# Patient Record
Sex: Female | Born: 1980
Health system: Southern US, Community
[De-identification: ages and names within clinical notes are randomized; demographics above are authoritative.]

## PROBLEM LIST (undated history)

## (undated) DIAGNOSIS — R011 Cardiac murmur, unspecified: Secondary | ICD-10-CM

## (undated) DIAGNOSIS — A6000 Herpesviral infection of urogenital system, unspecified: Secondary | ICD-10-CM

## (undated) DIAGNOSIS — N809 Endometriosis, unspecified: Secondary | ICD-10-CM

## (undated) DIAGNOSIS — Z789 Other specified health status: Secondary | ICD-10-CM

## (undated) DIAGNOSIS — D649 Anemia, unspecified: Secondary | ICD-10-CM

## (undated) HISTORY — PX: NO PAST SURGERIES: SHX2092

## (undated) HISTORY — DX: Anemia, unspecified: D64.9

---

## 2011-05-25 ENCOUNTER — Inpatient Hospital Stay (INDEPENDENT_AMBULATORY_CARE_PROVIDER_SITE_OTHER)
Admission: RE | Admit: 2011-05-25 | Discharge: 2011-05-25 | Disposition: A | Discharge status: Home / Self Care | Payer: Medicaid Other | Source: Ambulatory Visit | Attending: Family Medicine | Admitting: Family Medicine

## 2011-05-25 DIAGNOSIS — N760 Acute vaginitis: Secondary | ICD-10-CM

## 2011-05-25 LAB — POCT URINALYSIS DIP (DEVICE)
Hgb urine dipstick: NEGATIVE
Protein, ur: NEGATIVE mg/dL
Specific Gravity, Urine: 1.015 (ref 1.005–1.030)
Urobilinogen, UA: 0.2 mg/dL (ref 0.0–1.0)
pH: 7 (ref 5.0–8.0)

## 2011-05-25 LAB — WET PREP, GENITAL

## 2011-05-27 LAB — GC/CHLAMYDIA PROBE AMP, GENITAL
Chlamydia, DNA Probe: NEGATIVE
GC Probe Amp, Genital: NEGATIVE

## 2011-06-03 ENCOUNTER — Inpatient Hospital Stay (INDEPENDENT_AMBULATORY_CARE_PROVIDER_SITE_OTHER)
Admission: RE | Admit: 2011-06-03 | Discharge: 2011-06-03 | Disposition: A | Discharge status: Home / Self Care | Payer: Self-pay | Source: Ambulatory Visit | Attending: Family Medicine | Admitting: Family Medicine

## 2011-06-03 DIAGNOSIS — N760 Acute vaginitis: Secondary | ICD-10-CM

## 2011-06-03 LAB — POCT URINALYSIS DIP (DEVICE)
Bilirubin Urine: NEGATIVE
Glucose, UA: NEGATIVE mg/dL
Leukocytes, UA: NEGATIVE
Nitrite: NEGATIVE

## 2011-07-11 ENCOUNTER — Inpatient Hospital Stay (INDEPENDENT_AMBULATORY_CARE_PROVIDER_SITE_OTHER)
Admission: RE | Admit: 2011-07-11 | Discharge: 2011-07-11 | Disposition: A | Discharge status: Home / Self Care | Payer: Medicaid Other | Source: Ambulatory Visit | Attending: Emergency Medicine | Admitting: Emergency Medicine

## 2011-07-11 DIAGNOSIS — N771 Vaginitis, vulvitis and vulvovaginitis in diseases classified elsewhere: Secondary | ICD-10-CM

## 2011-07-11 LAB — POCT URINALYSIS DIP (DEVICE)
Bilirubin Urine: NEGATIVE
Glucose, UA: NEGATIVE mg/dL
Leukocytes, UA: NEGATIVE
Nitrite: NEGATIVE
Urobilinogen, UA: 0.2 mg/dL (ref 0.0–1.0)
pH: 6.5 (ref 5.0–8.0)

## 2011-07-11 LAB — WET PREP, GENITAL
Trich, Wet Prep: NONE SEEN
Yeast Wet Prep HPF POC: NONE SEEN

## 2011-07-13 LAB — GC/CHLAMYDIA PROBE AMP, GENITAL: Chlamydia, DNA Probe: NEGATIVE

## 2011-07-31 ENCOUNTER — Encounter: Payer: Self-pay | Admitting: *Deleted

## 2011-07-31 ENCOUNTER — Emergency Department (HOSPITAL_BASED_OUTPATIENT_CLINIC_OR_DEPARTMENT_OTHER)
Admission: EM | Admit: 2011-07-31 | Discharge: 2011-07-31 | Disposition: A | Discharge status: Home / Self Care | Payer: Self-pay | Attending: Emergency Medicine | Admitting: Emergency Medicine

## 2011-07-31 DIAGNOSIS — N898 Other specified noninflammatory disorders of vagina: Secondary | ICD-10-CM | POA: Insufficient documentation

## 2011-07-31 DIAGNOSIS — F17200 Nicotine dependence, unspecified, uncomplicated: Secondary | ICD-10-CM | POA: Insufficient documentation

## 2011-07-31 DIAGNOSIS — N939 Abnormal uterine and vaginal bleeding, unspecified: Secondary | ICD-10-CM

## 2011-07-31 LAB — CBC
HCT: 39.8 % (ref 36.0–46.0)
Hemoglobin: 13.8 g/dL (ref 12.0–15.0)
MCHC: 34.7 g/dL (ref 30.0–36.0)
RBC: 4.41 MIL/uL (ref 3.87–5.11)

## 2011-07-31 LAB — PROTIME-INR: INR: 1.01 (ref 0.00–1.49)

## 2011-07-31 LAB — WET PREP, GENITAL: Yeast Wet Prep HPF POC: NONE SEEN

## 2011-07-31 LAB — URINALYSIS, ROUTINE W REFLEX MICROSCOPIC
Glucose, UA: NEGATIVE mg/dL
Ketones, ur: NEGATIVE mg/dL
Leukocytes, UA: NEGATIVE
Nitrite: NEGATIVE
Specific Gravity, Urine: 1.015 (ref 1.005–1.030)
pH: 6 (ref 5.0–8.0)

## 2011-07-31 LAB — DIFFERENTIAL
Basophils Relative: 0 % (ref 0–1)
Lymphs Abs: 2.5 10*3/uL (ref 0.7–4.0)
Monocytes Absolute: 0.5 10*3/uL (ref 0.1–1.0)
Monocytes Relative: 6 % (ref 3–12)
Neutro Abs: 4.8 10*3/uL (ref 1.7–7.7)

## 2011-07-31 LAB — PREGNANCY, URINE: Preg Test, Ur: NEGATIVE

## 2011-07-31 LAB — URINE MICROSCOPIC-ADD ON

## 2011-07-31 NOTE — ED Notes (Signed)
Pt c/o vaginal bleeding x 10 days

## 2011-08-01 LAB — GC/CHLAMYDIA PROBE AMP, GENITAL
Chlamydia, DNA Probe: NEGATIVE
GC Probe Amp, Genital: NEGATIVE

## 2011-08-01 NOTE — ED Provider Notes (Signed)
History     CSN: [id number] Arrival date & time: 07/31/2011 10:24 PM  Chief Complaint  Patient presents with  . Vaginal Bleeding   HPI Comments: C/o vaginal bleeding x 10 days.  Unable to quantify, but may be as many as 5 pads per day with small clots.  Bleeding started after she had taken a friend's OCPs for a month.  Associated with crampy suprapubic pain.  No fever, vomiting, back pain.  No dysuria.  Afraid to take motrin for pain because of the bleeding.  LMP 3 weeks ago.  Does not have PCP.  Denies chest pain, SOB, dizziness, lightheadedness.  Does feel fatigue.  The history is provided by the patient.    History reviewed. No pertinent past medical history.  History reviewed. No pertinent past surgical history.  History reviewed. No pertinent family history.  History  Substance Use Topics  . Smoking status: Current Some Day Smoker -- 0.5 packs/day  . Smokeless tobacco: Not on file  . Alcohol Use: No    OB History    Grav Para Term Preterm Abortions TAB SAB Ect Mult Living                  Review of Systems  Constitutional: Positive for fatigue. Negative for fever, activity change and appetite change.  HENT: Negative for congestion and rhinorrhea.   Eyes: Negative for visual disturbance.  Respiratory: Negative for cough and shortness of breath.   Cardiovascular: Negative for chest pain.  Gastrointestinal: Positive for abdominal pain. Negative for nausea and vomiting.  Genitourinary: Positive for vaginal bleeding, vaginal pain and pelvic pain.  Musculoskeletal: Negative for back pain.  Skin: Negative for pallor.  Neurological: Positive for weakness. Negative for dizziness, light-headedness and headaches.    Physical Exam  BP 124/78  Pulse 84  Temp 98.4 F (36.9 C)  Resp 18  Wt 120 lb (54.432 kg)  SpO2 100%  LMP 07/31/2011  Physical Exam  Constitutional: She is oriented to person, place, and time. She appears well-developed and well-nourished. No distress.      Tearful because "I'm so embarrassed"  HENT:  Head: Normocephalic and atraumatic.  Mouth/Throat: Oropharynx is clear and moist. No oropharyngeal exudate.  Eyes: Conjunctivae are normal. Pupils are equal, round, and reactive to light.  Neck: Normal range of motion.  Cardiovascular: Normal rate, regular rhythm and normal heart sounds.   Pulmonary/Chest: Effort normal and breath sounds normal. No respiratory distress.  Abdominal: Soft. There is tenderness. There is no rebound and no guarding.       Suprapubic tenderness  Genitourinary: Cervix exhibits no motion tenderness. Right adnexum displays no mass. Left adnexum displays no mass.       Dark blood in vaginal vault, no active bleeding  Musculoskeletal: Normal range of motion. She exhibits no edema and no tenderness.  Neurological: She is alert and oriented to person, place, and time. No cranial nerve deficit.  Skin: Skin is warm.    ED Course  Procedures  MDM Vaginal bleeding, r/o pregnancy.  Vitals stable. Will check orthostatics, h/h, coags, UA.  HCG neg, Hemoglobin normal.  Orthostatics mildly positive.  Tolerating PO in ED. Advised patient to refrain from taking unprescribed hormone therapy.  GYN referral given.  Results for orders placed during the hospital encounter of 07/31/11  CBC      Component Value Range   WBC 8.0  4.0 - 10.5 (K/uL)   RBC 4.41  3.87 - 5.11 (MIL/uL)   Hemoglobin 13.8  12.0 -  15.0 (g/dL)   HCT 16.1  09.6 - 04.5 (%)   MCV 90.2  78.0 - 100.0 (fL)   MCH 31.3  26.0 - 34.0 (pg)   MCHC 34.7  30.0 - 36.0 (g/dL)   RDW 40.9  81.1 - 91.4 (%)   Platelets 212  150 - 400 (K/uL)  DIFFERENTIAL      Component Value Range   Neutrophils Relative 60  43 - 77 (%)   Neutro Abs 4.8  1.7 - 7.7 (K/uL)   Lymphocytes Relative 31  12 - 46 (%)   Lymphs Abs 2.5  0.7 - 4.0 (K/uL)   Monocytes Relative 6  3 - 12 (%)   Monocytes Absolute 0.5  0.1 - 1.0 (K/uL)   Eosinophils Relative 3  0 - 5 (%)   Eosinophils Absolute 0.2   0.0 - 0.7 (K/uL)   Basophils Relative 0  0 - 1 (%)   Basophils Absolute 0.0  0.0 - 0.1 (K/uL)  PROTIME-INR      Component Value Range   Prothrombin Time 13.5  11.6 - 15.2 (seconds)   INR 1.01  0.00 - 1.49   URINALYSIS, ROUTINE W REFLEX MICROSCOPIC      Component Value Range   Color, Urine YELLOW  YELLOW    Appearance CLEAR  CLEAR    Specific Gravity, Urine 1.015  1.005 - 1.030    pH 6.0  5.0 - 8.0    Glucose, UA NEGATIVE  NEGATIVE (mg/dL)   Hgb urine dipstick LARGE (*) NEGATIVE    Bilirubin Urine NEGATIVE  NEGATIVE    Ketones, ur NEGATIVE  NEGATIVE (mg/dL)   Protein, ur NEGATIVE  NEGATIVE (mg/dL)   Urobilinogen, UA 1.0  0.0 - 1.0 (mg/dL)   Nitrite NEGATIVE  NEGATIVE    Leukocytes, UA NEGATIVE  NEGATIVE   PREGNANCY, URINE      Component Value Range   Preg Test, Ur NEGATIVE    WET PREP, GENITAL      Component Value Range   Yeast, Wet Prep NONE SEEN  NONE SEEN    Trich, Wet Prep NONE SEEN  NONE SEEN    Clue Cells, Wet Prep NONE SEEN  NONE SEEN    WBC, Wet Prep HPF POC FEW (*) NONE SEEN   URINE MICROSCOPIC-ADD ON      Component Value Range   Squamous Epithelial / LPF RARE  RARE    WBC, UA 0-2  <3 (WBC/hpf)   RBC / HPF 21-50  <3 (RBC/hpf)   Bacteria, UA FEW (*) RARE    No results found.        Glynn Octave, MD 08/01/11 1125

## 2012-05-18 ENCOUNTER — Emergency Department (HOSPITAL_COMMUNITY)
Admission: EM | Admit: 2012-05-18 | Discharge: 2012-05-18 | Disposition: A | Discharge status: Home / Self Care | Payer: Self-pay | Attending: Emergency Medicine | Admitting: Emergency Medicine

## 2012-05-18 ENCOUNTER — Encounter (HOSPITAL_COMMUNITY): Payer: Self-pay | Admitting: *Deleted

## 2012-05-18 DIAGNOSIS — L0390 Cellulitis, unspecified: Secondary | ICD-10-CM

## 2012-05-18 DIAGNOSIS — L02419 Cutaneous abscess of limb, unspecified: Secondary | ICD-10-CM | POA: Insufficient documentation

## 2012-05-18 DIAGNOSIS — F17200 Nicotine dependence, unspecified, uncomplicated: Secondary | ICD-10-CM | POA: Insufficient documentation

## 2012-05-18 DIAGNOSIS — L03119 Cellulitis of unspecified part of limb: Secondary | ICD-10-CM | POA: Insufficient documentation

## 2012-05-18 MED ORDER — HYDROCODONE-ACETAMINOPHEN 5-325 MG PO TABS: 1.0000 | ORAL_TABLET | ORAL | Status: AC | PRN

## 2012-05-18 MED ORDER — CEPHALEXIN 500 MG PO CAPS: 500.0000 mg | ORAL_CAPSULE | Freq: Four times a day (QID) | ORAL | Status: AC

## 2012-05-18 MED ORDER — CEPHALEXIN 250 MG PO CAPS
500.0000 mg | ORAL_CAPSULE | Freq: Once | ORAL | Status: AC
  Administered 2012-05-18: 500 mg via ORAL
  Filled 2012-05-18: qty 2

## 2012-05-18 MED ORDER — HYDROCODONE-ACETAMINOPHEN 5-325 MG PO TABS
1.0000 | ORAL_TABLET | Freq: Once | ORAL | Status: AC
  Administered 2012-05-18: 1 via ORAL
  Filled 2012-05-18: qty 1

## 2012-05-18 NOTE — ED Notes (Signed)
C/o R medial thigh redness, pain and swelling. 1st noticed Saturday night. Gradually progressively worse, larger. Also report itching. Has had benadryl 50mg  ~ 30 minutes.

## 2012-05-18 NOTE — ED Provider Notes (Signed)
History     CSN: [id number]  Arrival date & time 05/18/12  4098   First MD Initiated Contact with Patient 05/18/12 0340      Chief Complaint  Patient presents with  . Insect Bite    (Consider location/radiation/quality/duration/timing/severity/associated sxs/prior treatment) HPI Comments: Patient here with what she thinks may have been an insect bite to her right upper thigh - she states that since then the area has been itchy and now with redness surrounding the area.  She denies fever, chills, nausea, vomiting, - she took benadryl to help with the itching but nothing for the pain.  Patient is a 31 y.o. female presenting with rash. The history is provided by the patient. No language interpreter was used.  Rash  This is a new problem. The current episode started 2 days ago. The problem has not changed since onset.The problem is associated with an insect bite/sting. There has been no fever. The rash is present on the right upper leg. The pain is at a severity of 5/10. The pain is moderate. The pain has been constant since onset. Associated symptoms include itching and pain. Pertinent negatives include no blisters and no weeping.    History reviewed. No pertinent past medical history.  History reviewed. No pertinent past surgical history.  No family history on file.  History  Substance Use Topics  . Smoking status: Current Some Day Smoker -- 0.5 packs/day  . Smokeless tobacco: Not on file  . Alcohol Use: No    OB History    Grav Para Term Preterm Abortions TAB SAB Ect Mult Living                  Review of Systems  Constitutional: Negative for fever and chills.  HENT: Negative for neck pain.   Eyes: Negative for pain.  Respiratory: Negative for chest tightness and shortness of breath.   Cardiovascular: Negative for chest pain.  Gastrointestinal: Negative for nausea, vomiting and abdominal pain.  Genitourinary: Negative for dysuria.  Musculoskeletal: Negative for back  pain.  Skin: Positive for itching and rash.  Neurological: Negative for headaches.  All other systems reviewed and are negative.    Allergies  Review of patient's allergies indicates no known allergies.  Home Medications   Current Outpatient Rx  Name Route Sig Dispense Refill  . ONE-DAILY MULTI VITAMINS PO TABS Oral Take 1 tablet by mouth daily.        BP 95/52  Pulse 87  Temp 98.6 F (37 C) (Oral)  Resp 16  SpO2 98%  LMP 05/13/2012  Physical Exam  Nursing note and vitals reviewed. Constitutional: She is oriented to person, place, and time. She appears well-developed and well-nourished. No distress.  HENT:  Head: Normocephalic and atraumatic.  Right Ear: External ear normal.  Left Ear: External ear normal.  Nose: Nose normal.  Mouth/Throat: Oropharynx is clear and moist. No oropharyngeal exudate.  Eyes: Conjunctivae are normal. Pupils are equal, round, and reactive to light. No scleral icterus.  Neck: Normal range of motion. Neck supple.  Cardiovascular: Normal rate, regular rhythm and normal heart sounds.  Exam reveals no gallop and no friction rub.   No murmur heard. Pulmonary/Chest: Effort normal and breath sounds normal. No respiratory distress. She has no wheezes. She has no rales. She exhibits no tenderness.  Abdominal: Soft. Bowel sounds are normal. She exhibits no distension. There is no tenderness.  Musculoskeletal: Normal range of motion. She exhibits no edema and no tenderness.  Lymphadenopathy:  She has no cervical adenopathy.  Neurological: She is alert and oriented to person, place, and time. No cranial nerve deficit. She exhibits normal muscle tone. Coordination normal.  Skin: Skin is warm and dry. Rash noted. There is erythema. No pallor.       10 cm x 5 cm area of induration to right upper thigh, no fluctuance noted.  Psychiatric: She has a normal mood and affect. Her behavior is normal. Judgment and thought content normal.    ED Course  Procedures  (including critical care time)  Labs Reviewed - No data to display No results found.   Cellulitis to right thigh   MDM  Patient here with cellulitis to the right thigh, she is afebrile and there are no signs of systemic infection, I do not suspect tick or vector borne infection as well.  Started on abx here and she will return to the Urgent Care in 2 days for re-check.        Izola Price [place], [place] 05/18/12 [phone]

## 2012-05-18 NOTE — ED Provider Notes (Signed)
Medical screening examination/treatment/procedure(s) were performed by non-physician practitioner and as supervising physician I was immediately available for consultation/collaboration.   Jasmine Awe, MD 05/18/12 [phone]

## 2012-05-18 NOTE — ED Notes (Addendum)
Pt to ED c/o infected bite.  She does not know what bit her, but she remembers getting bitten by an insect.  Approx 6 x 6 red, warm and hard site with a pustule the size of a pinhead at the center.  Pt denies sob or fevers.

## 2012-05-18 NOTE — Discharge Instructions (Signed)

## 2012-12-24 ENCOUNTER — Emergency Department (INDEPENDENT_AMBULATORY_CARE_PROVIDER_SITE_OTHER)
Admission: EM | Admit: 2012-12-24 | Discharge: 2012-12-24 | Disposition: A | Discharge status: Home / Self Care | Payer: BC Managed Care – PPO | Source: Home / Self Care | Attending: Emergency Medicine | Admitting: Emergency Medicine

## 2012-12-24 ENCOUNTER — Encounter (HOSPITAL_COMMUNITY): Payer: Self-pay | Admitting: *Deleted

## 2012-12-24 DIAGNOSIS — M65839 Other synovitis and tenosynovitis, unspecified forearm: Secondary | ICD-10-CM

## 2012-12-24 DIAGNOSIS — M778 Other enthesopathies, not elsewhere classified: Secondary | ICD-10-CM

## 2012-12-24 MED ORDER — CYCLOBENZAPRINE HCL 5 MG PO TABS: 5.0000 mg | ORAL_TABLET | Freq: Three times a day (TID) | ORAL | Status: DC | PRN

## 2012-12-24 MED ORDER — NAPROXEN 500 MG PO TABS: 500.0000 mg | ORAL_TABLET | Freq: Two times a day (BID) | ORAL | Status: DC

## 2012-12-24 NOTE — ED Provider Notes (Signed)
History     CSN: [id number]  Arrival date & time 12/24/12  1135   First MD Initiated Contact with Patient 12/24/12 1359      No chief complaint on file.   (Consider location/radiation/quality/duration/timing/severity/associated sxs/prior treatment) HPI Comments: Pt has had pain in R thenar area before, usually with gripping flat iron for hair.  2 weeks ago, pain became more constant, and has gradually gotten worse. Denies injury.  Patient is a 32 y.o. female presenting with hand pain.  Hand Pain This is a new problem. Episode onset: 2 weeks ago. The problem occurs constantly. The problem has been gradually worsening. The symptoms are aggravated by exertion (using hand for gripping or twisting caps off bottles (pt works at Associate Professor at [employer])). Nothing relieves the symptoms. She has tried a warm compress and rest (ibuprofen once yesterday) for the symptoms. The treatment provided no relief.    History reviewed. No pertinent past medical history.  History reviewed. No pertinent past surgical history.  History reviewed. No pertinent family history.  History  Substance Use Topics  . Smoking status: Current Some Day Smoker -- 0.5 packs/day  . Smokeless tobacco: Not on file  . Alcohol Use: No    OB History    Grav Para Term Preterm Abortions TAB SAB Ect Mult Living                  Review of Systems  Constitutional: Negative for fever and chills.  Musculoskeletal: Positive for joint swelling.       Swelling of base of R thumb, pain with active use of hand  Skin: Negative for color change and rash.  Neurological: Negative for weakness and numbness.    Allergies  Review of patient's allergies indicates no known allergies.  Home Medications   Current Outpatient Rx  Name  Route  Sig  Dispense  Refill  . CYCLOBENZAPRINE HCL 5 MG PO TABS   Oral   Take 1 tablet (5 mg total) by mouth 3 (three) times daily as needed for muscle spasms.   21 tablet   0   . ONE-DAILY  MULTI VITAMINS PO TABS   Oral   Take 1 tablet by mouth daily.           Marland Kitchen NAPROXEN 500 MG PO TABS   Oral   Take 1 tablet (500 mg total) by mouth 2 (two) times daily.   28 tablet   0     BP 113/69  Pulse 65  Temp 98.3 F (36.8 C) (Oral)  Resp 16  SpO2 99%  LMP 12/10/2012  Physical Exam  Constitutional: She appears well-developed and well-nourished. No distress.  Musculoskeletal:       Right wrist: Normal.       Right hand: She exhibits decreased range of motion, tenderness and swelling. She exhibits no bony tenderness, normal capillary refill and no deformity. normal sensation noted.       Hands: Neurological: No sensory deficit. She exhibits normal muscle tone.    ED Course  Procedures (including critical care time)  Labs Reviewed - No data to display No results found.   1. Hand tendonitis       MDM  Likely repetitive use injury.  Will tx conservatively.  If not improvement sx, pt to return.  Would consider referral to hand at that time.         Cathlyn Parsons, NP 12/24/12 [phone]

## 2012-12-24 NOTE — ED Provider Notes (Signed)
Medical screening examination/treatment/procedure(s) were performed by non-physician practitioner and as supervising physician I was immediately available for consultation/collaboration.  Kyre Jeffries, M.D.   Dilyn Smiles C Maximos Zayas, MD 12/24/12 2110 

## 2012-12-24 NOTE — ED Notes (Signed)
Pt reports     Pain  r  Hand      X  sev  Weeks     Pain is  Worse  On  Certain  Functions     denys  Any  specefic  Injury

## 2012-12-24 NOTE — ED Notes (Signed)
Universal  r   Wrist   Splint  Applied    

## 2013-03-18 ENCOUNTER — Encounter (HOSPITAL_COMMUNITY): Payer: Self-pay | Admitting: Pharmacist

## 2013-03-26 ENCOUNTER — Encounter (HOSPITAL_COMMUNITY)
Admission: RE | Admit: 2013-03-26 | Discharge: 2013-03-26 | Disposition: A | Discharge status: Home / Self Care | Payer: BC Managed Care – PPO | Source: Ambulatory Visit | Attending: Obstetrics and Gynecology | Admitting: Obstetrics and Gynecology

## 2013-03-26 ENCOUNTER — Encounter (HOSPITAL_COMMUNITY): Payer: Self-pay

## 2013-03-26 DIAGNOSIS — Z01818 Encounter for other preprocedural examination: Secondary | ICD-10-CM | POA: Insufficient documentation

## 2013-03-26 DIAGNOSIS — Z01812 Encounter for preprocedural laboratory examination: Secondary | ICD-10-CM | POA: Insufficient documentation

## 2013-03-26 HISTORY — DX: Other specified health status: Z78.9

## 2013-03-26 LAB — CBC
MCH: 33.2 pg (ref 26.0–34.0)
MCV: 96.9 fL (ref 78.0–100.0)
Platelets: 181 10*3/uL (ref 150–400)
RDW: 13.4 % (ref 11.5–15.5)

## 2013-03-26 NOTE — Patient Instructions (Signed)
Your procedure is scheduled on:04/02/13  Enter through the Main Entrance at :11am Pick up desk phone and dial [phone] and inform us of your arrival.  Please call [phone] if you have any problems the morning of surgery.  Remember: Do not eat after midnight:Thursday Clear liquids ok until 0830 am on FRIDAY   DO NOT wear jewelry, eye make-up, lipstick,body lotion, or dark fingernail polish.   Patients discharged on the day of surgery will not be allowed to drive home.

## 2013-04-02 ENCOUNTER — Encounter (HOSPITAL_COMMUNITY): Payer: Self-pay | Admitting: Anesthesiology

## 2013-04-02 ENCOUNTER — Encounter (HOSPITAL_COMMUNITY)
Admission: RE | Disposition: A | Discharge status: Home / Self Care | Payer: Self-pay | Source: Ambulatory Visit | Attending: Obstetrics and Gynecology

## 2013-04-02 ENCOUNTER — Ambulatory Visit (HOSPITAL_COMMUNITY)
Admission: RE | Admit: 2013-04-02 | Discharge: 2013-04-02 | Disposition: A | Discharge status: Home / Self Care | Payer: BC Managed Care – PPO | Source: Ambulatory Visit | Attending: Obstetrics and Gynecology | Admitting: Obstetrics and Gynecology

## 2013-04-02 ENCOUNTER — Ambulatory Visit (HOSPITAL_COMMUNITY): Payer: BC Managed Care – PPO | Admitting: Anesthesiology

## 2013-04-02 DIAGNOSIS — N920 Excessive and frequent menstruation with regular cycle: Secondary | ICD-10-CM | POA: Insufficient documentation

## 2013-04-02 DIAGNOSIS — N979 Female infertility, unspecified: Secondary | ICD-10-CM | POA: Insufficient documentation

## 2013-04-02 DIAGNOSIS — N803 Endometriosis of pelvic peritoneum, unspecified: Secondary | ICD-10-CM | POA: Insufficient documentation

## 2013-04-02 DIAGNOSIS — N80109 Endometriosis of ovary, unspecified side, unspecified depth: Secondary | ICD-10-CM | POA: Insufficient documentation

## 2013-04-02 DIAGNOSIS — N801 Endometriosis of ovary: Secondary | ICD-10-CM | POA: Insufficient documentation

## 2013-04-02 DIAGNOSIS — IMO0002 Reserved for concepts with insufficient information to code with codable children: Secondary | ICD-10-CM | POA: Insufficient documentation

## 2013-04-02 DIAGNOSIS — D251 Intramural leiomyoma of uterus: Secondary | ICD-10-CM | POA: Insufficient documentation

## 2013-04-02 HISTORY — PX: LAPAROSCOPY: SHX197

## 2013-04-02 HISTORY — PX: LAPAROSCOPIC LYSIS OF ADHESIONS: SHX5905

## 2013-04-02 HISTORY — PX: MYOMECTOMY: SHX85

## 2013-04-02 LAB — TYPE AND SCREEN
ABO/RH(D): B POS
Antibody Screen: NEGATIVE

## 2013-04-02 LAB — PREGNANCY, URINE: Preg Test, Ur: NEGATIVE

## 2013-04-02 LAB — ABO/RH: ABO/RH(D): B POS

## 2013-04-02 SURGERY — LAPAROSCOPY OPERATIVE
Anesthesia: General | Wound class: Clean Contaminated

## 2013-04-02 MED ORDER — BUPIVACAINE-EPINEPHRINE 0.25% -1:200000 IJ SOLN
INTRAMUSCULAR | Status: DC | PRN
  Administered 2013-04-02: 8 mL

## 2013-04-02 MED ORDER — PROPOFOL 10 MG/ML IV BOLUS
INTRAVENOUS | Status: DC | PRN
  Administered 2013-04-02: 170 mg via INTRAVENOUS

## 2013-04-02 MED ORDER — KETOROLAC TROMETHAMINE 30 MG/ML IJ SOLN
INTRAMUSCULAR | Status: DC | PRN
  Administered 2013-04-02: 30 mg via INTRAVENOUS

## 2013-04-02 MED ORDER — NEOSTIGMINE METHYLSULFATE 1 MG/ML IJ SOLN
INTRAMUSCULAR | Status: AC
  Filled 2013-04-02: qty 1

## 2013-04-02 MED ORDER — MIDAZOLAM HCL 5 MG/5ML IJ SOLN
INTRAMUSCULAR | Status: DC | PRN
  Administered 2013-04-02: 2 mg via INTRAVENOUS

## 2013-04-02 MED ORDER — KETOROLAC TROMETHAMINE 30 MG/ML IJ SOLN
INTRAMUSCULAR | Status: AC
  Filled 2013-04-02: qty 1

## 2013-04-02 MED ORDER — NORETHINDRONE ACETATE 5 MG PO TABS: 2.5000 mg | ORAL_TABLET | Freq: Every day | ORAL | Status: DC

## 2013-04-02 MED ORDER — LETROZOLE 2.5 MG PO TABS: 2.5000 mg | ORAL_TABLET | Freq: Every day | ORAL | Status: DC

## 2013-04-02 MED ORDER — HYDROCODONE-ACETAMINOPHEN 5-300 MG PO TABS: 2.0000 | ORAL_TABLET | Freq: Four times a day (QID) | ORAL | Status: DC | PRN

## 2013-04-02 MED ORDER — HYDROCODONE-ACETAMINOPHEN 5-325 MG PO TABS: 1.0000 | ORAL_TABLET | Freq: Once | ORAL | Status: AC

## 2013-04-02 MED ORDER — HYDROCODONE-ACETAMINOPHEN 5-325 MG PO TABS
ORAL_TABLET | ORAL | Status: AC
  Administered 2013-04-02: 1 via ORAL
  Filled 2013-04-02: qty 1

## 2013-04-02 MED ORDER — LACTATED RINGERS IV SOLN
INTRAVENOUS | Status: DC
  Administered 2013-04-02 (×2): via INTRAVENOUS

## 2013-04-02 MED ORDER — INDIGOTINDISULFONATE SODIUM 8 MG/ML IJ SOLN
INTRAMUSCULAR | Status: AC
  Filled 2013-04-02: qty 5

## 2013-04-02 MED ORDER — LIDOCAINE HCL (CARDIAC) 20 MG/ML IV SOLN
INTRAVENOUS | Status: DC | PRN
  Administered 2013-04-02: 50 mg via INTRAVENOUS

## 2013-04-02 MED ORDER — VASOPRESSIN 20 UNIT/ML IJ SOLN
INTRAMUSCULAR | Status: AC
  Filled 2013-04-02: qty 2

## 2013-04-02 MED ORDER — KETOROLAC TROMETHAMINE 30 MG/ML IJ SOLN: 15.0000 mg | Freq: Once | INTRAMUSCULAR | Status: DC | PRN

## 2013-04-02 MED ORDER — GLYCOPYRROLATE 0.2 MG/ML IJ SOLN
INTRAMUSCULAR | Status: AC
  Filled 2013-04-02: qty 2

## 2013-04-02 MED ORDER — FENTANYL CITRATE 0.05 MG/ML IJ SOLN
INTRAMUSCULAR | Status: AC
  Filled 2013-04-02: qty 5

## 2013-04-02 MED ORDER — FENTANYL CITRATE 0.05 MG/ML IJ SOLN
25.0000 ug | INTRAMUSCULAR | Status: DC | PRN
  Administered 2013-04-02 (×2): 50 ug via INTRAVENOUS

## 2013-04-02 MED ORDER — ONDANSETRON HCL 4 MG/2ML IJ SOLN: 4.0000 mg | Freq: Once | INTRAMUSCULAR | Status: DC | PRN

## 2013-04-02 MED ORDER — FENTANYL CITRATE 0.05 MG/ML IJ SOLN
INTRAMUSCULAR | Status: AC
  Administered 2013-04-02: 50 ug via INTRAVENOUS
  Filled 2013-04-02: qty 8

## 2013-04-02 MED ORDER — ROCURONIUM BROMIDE 50 MG/5ML IV SOLN
INTRAVENOUS | Status: AC
  Filled 2013-04-02: qty 1

## 2013-04-02 MED ORDER — ROCURONIUM BROMIDE 100 MG/10ML IV SOLN
INTRAVENOUS | Status: DC | PRN
  Administered 2013-04-02: 60 mg via INTRAVENOUS

## 2013-04-02 MED ORDER — DEXAMETHASONE SODIUM PHOSPHATE 10 MG/ML IJ SOLN
INTRAMUSCULAR | Status: AC
  Filled 2013-04-02: qty 1

## 2013-04-02 MED ORDER — MIDAZOLAM HCL 2 MG/2ML IJ SOLN
INTRAMUSCULAR | Status: AC
  Filled 2013-04-02: qty 2

## 2013-04-02 MED ORDER — ONDANSETRON HCL 4 MG/2ML IJ SOLN
INTRAMUSCULAR | Status: DC | PRN
  Administered 2013-04-02: 4 mg via INTRAVENOUS

## 2013-04-02 MED ORDER — VASOPRESSIN 20 UNIT/ML IJ SOLN
INTRAVENOUS | Status: DC | PRN
  Administered 2013-04-02: 15:00:00 via INTRAMUSCULAR

## 2013-04-02 MED ORDER — FENTANYL CITRATE 0.05 MG/ML IJ SOLN
INTRAMUSCULAR | Status: AC
  Filled 2013-04-02: qty 2

## 2013-04-02 MED ORDER — CEFAZOLIN SODIUM-DEXTROSE 2-3 GM-% IV SOLR
INTRAVENOUS | Status: AC
  Administered 2013-04-02: 2 g via INTRAVENOUS
  Filled 2013-04-02: qty 50

## 2013-04-02 MED ORDER — DEXAMETHASONE SODIUM PHOSPHATE 10 MG/ML IJ SOLN
INTRAMUSCULAR | Status: DC | PRN
  Administered 2013-04-02: 10 mg via INTRAVENOUS

## 2013-04-02 MED ORDER — ONDANSETRON HCL 4 MG/2ML IJ SOLN
INTRAMUSCULAR | Status: AC
  Filled 2013-04-02: qty 2

## 2013-04-02 MED ORDER — CEFAZOLIN SODIUM-DEXTROSE 2-3 GM-% IV SOLR: 2.0000 g | INTRAVENOUS | Status: DC

## 2013-04-02 MED ORDER — INDIGOTINDISULFONATE SODIUM 8 MG/ML IJ SOLN
INTRAMUSCULAR | Status: DC | PRN
  Administered 2013-04-02: 5 mL via INTRAVENOUS

## 2013-04-02 MED ORDER — HEPARIN SODIUM (PORCINE) 5000 UNIT/ML IJ SOLN
INTRAMUSCULAR | Status: DC | PRN
  Administered 2013-04-02 (×3): 5000 [IU] via SUBCUTANEOUS

## 2013-04-02 MED ORDER — NEOSTIGMINE METHYLSULFATE 1 MG/ML IJ SOLN
INTRAMUSCULAR | Status: DC | PRN
  Administered 2013-04-02: 3 mg via INTRAVENOUS

## 2013-04-02 MED ORDER — BUPIVACAINE HCL (PF) 0.25 % IJ SOLN
INTRAMUSCULAR | Status: AC
  Filled 2013-04-02: qty 30

## 2013-04-02 MED ORDER — CHLOROPROCAINE HCL 1 % IJ SOLN
INTRAMUSCULAR | Status: AC
  Filled 2013-04-02: qty 30

## 2013-04-02 MED ORDER — GLYCOPYRROLATE 0.2 MG/ML IJ SOLN
INTRAMUSCULAR | Status: DC | PRN
  Administered 2013-04-02: .5 mg via INTRAVENOUS

## 2013-04-02 MED ORDER — MEPERIDINE HCL 25 MG/ML IJ SOLN: 6.2500 mg | INTRAMUSCULAR | Status: DC | PRN

## 2013-04-02 MED ORDER — LIDOCAINE HCL (CARDIAC) 20 MG/ML IV SOLN
INTRAVENOUS | Status: AC
  Filled 2013-04-02: qty 5

## 2013-04-02 MED ORDER — FENTANYL CITRATE 0.05 MG/ML IJ SOLN
INTRAMUSCULAR | Status: DC | PRN
  Administered 2013-04-02: 150 ug via INTRAVENOUS
  Administered 2013-04-02 (×2): 50 ug via INTRAVENOUS

## 2013-04-02 MED ORDER — PROPOFOL 10 MG/ML IV EMUL
INTRAVENOUS | Status: AC
  Filled 2013-04-02: qty 20

## 2013-04-02 SURGICAL SUPPLY — 47 items
CABLE HIGH FREQUENCY MONO STRZ (ELECTRODE) IMPLANT
CATH ROBINSON RED A/P 16FR (CATHETERS) IMPLANT
CLOTH BEACON ORANGE TIMEOUT ST (SAFETY) ×2 IMPLANT
DERMABOND ADVANCED (GAUZE/BANDAGES/DRESSINGS) ×1
DERMABOND ADVANCED .7 DNX12 (GAUZE/BANDAGES/DRESSINGS) ×1 IMPLANT
DEVICE TROCAR PUNCTURE CLOSURE (ENDOMECHANICALS) IMPLANT
ELECT NEEDLE TIP 2.8 STRL (NEEDLE) IMPLANT
ELECT REM PT RETURN 9FT ADLT (ELECTROSURGICAL) ×2
ELECTRODE REM PT RTRN 9FT ADLT (ELECTROSURGICAL) ×1 IMPLANT
EVACUATOR SMOKE 8.L (FILTER) ×2 IMPLANT
FORCEPS CUTTING 33CM 5MM (CUTTING FORCEPS) IMPLANT
GAUZE SPONGE 4X4 16PLY XRAY LF (GAUZE/BANDAGES/DRESSINGS) ×2 IMPLANT
GLOVE BIO SURGEON STRL SZ8 (GLOVE) ×2 IMPLANT
GLOVE BIOGEL PI IND STRL 8.5 (GLOVE) ×1 IMPLANT
GLOVE BIOGEL PI INDICATOR 8.5 (GLOVE) ×1
GLOVE INDICATOR 8.5 STRL (GLOVE) ×2 IMPLANT
GOWN PREVENTION PLUS LG XLONG (DISPOSABLE) ×4 IMPLANT
GOWN STRL REIN XL XLG (GOWN DISPOSABLE) ×6 IMPLANT
MANIPULATOR UTERINE 4.5 ZUMI (MISCELLANEOUS) ×2 IMPLANT
NEEDLE INSUFFLATION 120MM (ENDOMECHANICALS) ×2 IMPLANT
NS IRRIG 1000ML POUR BTL (IV SOLUTION) ×2 IMPLANT
PACK ABDOMINAL GYN (CUSTOM PROCEDURE TRAY) IMPLANT
PACK LAPAROSCOPY BASIN (CUSTOM PROCEDURE TRAY) ×2 IMPLANT
PAD OB MATERNITY 4.3X12.25 (PERSONAL CARE ITEMS) ×2 IMPLANT
PENCIL BUTTON HOLSTER BLD 10FT (ELECTRODE) IMPLANT
POUCH SPECIMEN RETRIEVAL 10MM (ENDOMECHANICALS) IMPLANT
PROTECTOR NERVE ULNAR (MISCELLANEOUS) ×2 IMPLANT
SEPRAFILM MEMBRANE 5X6 (MISCELLANEOUS) ×4 IMPLANT
SET IRRIG TUBING LAPAROSCOPIC (IRRIGATION / IRRIGATOR) ×2 IMPLANT
SUT MNCRL AB 4-0 PS2 18 (SUTURE) ×4 IMPLANT
SUT PROLENE 0 CT 1 30 (SUTURE) IMPLANT
SUT VIC AB 2-0 CT1 27 (SUTURE)
SUT VIC AB 2-0 CT1 TAPERPNT 27 (SUTURE) IMPLANT
SUT VIC AB 2-0 CTB1 (SUTURE) ×4 IMPLANT
SUT VIC AB 2-0 UR6 27 (SUTURE) ×2 IMPLANT
SUT VICRYL 0 TIES 12 18 (SUTURE) IMPLANT
SYR 20CC LL (SYRINGE) IMPLANT
SYR 50ML LL SCALE MARK (SYRINGE) IMPLANT
SYR TOOMEY 50ML (SYRINGE) ×2 IMPLANT
SYS LAPSCP GELPORT 120MM (MISCELLANEOUS) ×2
SYSTEM LAPSCP GELPORT 120MM (MISCELLANEOUS) ×1 IMPLANT
TOWEL OR 17X24 6PK STRL BLUE (TOWEL DISPOSABLE) ×6 IMPLANT
TRAY FOLEY CATH 14FR (SET/KITS/TRAYS/PACK) ×2 IMPLANT
TROCAR OPTI TIP 5M 100M (ENDOMECHANICALS) ×6 IMPLANT
TROCAR XCEL DIL TIP R 11M (ENDOMECHANICALS) ×2 IMPLANT
WARMER LAPAROSCOPE (MISCELLANEOUS) ×2 IMPLANT
WATER STERILE IRR 1000ML POUR (IV SOLUTION) ×2 IMPLANT

## 2013-04-02 NOTE — Op Note (Signed)
Operative Note  Preoperative diagnosis: Transmural uterine leiomyoma, history of right ovarian cyst, severe dyspareunia  Postoperative diagnosis: Transmural uterine leiomyoma, stage IV endometriosis of tubes ovaries peritoneum and posterior cul-de-sac.  Procedure: Laparoscopy, GelPort assisted myomectomy, lysis of adhesions, excision of endometriosis, right salpingo-oophorolysis  Anesthesia: Gen. endotracheal  Complications: None  Estimated blood loss: 100 cc  Specimens: Myoma and endometrial polyp and anterior cul-de-sac biopsy to Pathology.  Findings: On exam under anesthesia the uterus was eight-week size, retroverted, firm and somewhat fixed. No cul-de-sac induration or nodularity was palpable. No adnexal enlargement was appreciated. On laparoscopy, upper abdomen, liver surface and diaphragm surfaces as well as gallbladder were normal. The appendix was within normal limits. Anterior cul-de-sac contained a cluster of brown and fibrotic lesions on the left. The uterus contained a 5 cm right anterior transmural myoma. Severe endometriosis related scarring was noted in the posterior pelvis. The cul-de-sac was completely obliterated. It was partially released by blunt dissection at the end of the case. The left tube was twisted and adherent to the broad ligament and the left ovary although the fimbriated end was visible. The left ovary was completely encased. After blunt dissection it could be 50% freed up. The right tube was similarly adherent onto itself as if it was triplicated. It had normal fimbriated end. Externally it appeared shortened, because of probable twisting with adhesions. The right ovary was adherent to the uterus and to the posterior cul-de-sac. The ovary was adherent with two thirds of its surface area but it was completely freed up by blunt and sharp dissection by the end of the case. Anterior surface contained a small endometrioma, which may explain why she had occasional findings  of a sonolucency on pelvic ultrasounds, as this area may be expanding with endometriotic blood. Description of the procedure: The patient was placed in dorsal supine position and general endotracheal anesthesia was given. 2 g of cefazolin were given intravenously for prophylaxis. Patient was placed in lithotomy position. She was prepped and draped inside manner. A Foley catheter was inserted into the bladder. A ZUMI catheter was placed into the uterine cavity. The uterus sounded to 8 cm. The surgeon was regloved and a surgical field was created on the abdomen.  After preemptive anesthesia of all surgical sites with 0.25% bupivacaine with 1:200,000 epinephrine, a 5 mm intraumbilical skin incision was made and a Verress needle was inserted. Its correct location was confirmed. A pneumoperitoneum was created with carbon dioxide. A left lower quadrant 5 mm and a right lower quadrant 10 mm incisions were made and ancillary trochars were placed under direct visualization. A 3 cm incision was made suprapubically and the fascia was cut transversely the rectus muscles were separated and the peritoneum was incised in vertical manner. A GelPort was placed and used throughout the case. Above findings were noted.  Using needle electrode the anterior cul-de-sac lesion was excised. 0.33 units per mL dilute vasopressin solution was injected into the superficial myometrium of the uterus. A transverse uterine incision was made over the myoma by the laparoscope and the myoma was dissected by blunt and sharp dissection. The endometrium was entered over a 1 cm segment and the polyp spontaneously was extruded. This was also sent to pathology. The myoma defect was cleared closed in 3 layers the first layer and the second layer were with 2-0 Vicryl continuous suture, the third layer was with 4-0 Vicryl continuous suture. Using blunt and needle electrode electrosurgical dissection both via the laparoscope and via the mean and  lap  incision extensive adhesiolyse this was carried out.  No dermoids were encountered (none were seen on ultrasound). Instead the small endometrioma between the right ovary and the uterine wall was noted. This may be collecting blood from time to time.   No further attempt was made to preop the twisted left tube since the adhesions were of cohesive type. Similarly no attempt was made to free up the left ovary from the left pelvic sidewall. The pelvis was vigorously irrigated and aspirated and hemostasis was checked. A slurry of 2 sheets of Seprafilm in lactated Ringer 40 mL was instilled into the pelvis as an adhesion barrier.  The gas was allowed to escape. Instruments were removed. The instrument and lap count were correct. The fascia of the 3 cm incision was approximated with 2-0 Vicryl on a UR 6 needle. The rest of the incisions were approximated with Dermabond.  The patient tolerated the procedure well and was transferred to recovery room in satisfactory condition. She will be placed on Femara and norethindrone acetate continuously for endometriosis suppression for 3 months. If she doesn't conceive through her right fallopian tube, she'll be a candidate for IVF.

## 2013-04-02 NOTE — Anesthesia Postprocedure Evaluation (Signed)
  Anesthesia Post Note  Patient: Katherine Holmes  Procedure(s) Performed: Procedure(s) (LRB): Operative Laparoscopy with Gel Port-Assisted Myomectomy and Excision of Endometriosis (N/A) ASSISTED MYOMECTOMY (N/A) LAPAROSCOPIC LYSIS OF ADHESIONS (N/A)  Anesthesia type: GA  Patient location: PACU  Post pain: Pain level controlled  Post assessment: Post-op Vital signs reviewed  Last Vitals:  Filed Vitals:   04/02/13 1540  BP: 117/67  Pulse: 71  Temp: 37.1 C  Resp: 16    Post vital signs: Reviewed  Level of consciousness: sedated  Complications: No apparent anesthesia complications

## 2013-04-02 NOTE — Anesthesia Preprocedure Evaluation (Signed)
Anesthesia Evaluation  Patient identified by MRN, date of birth, ID band Patient awake    Reviewed: Allergy & Precautions, H&P , NPO status , Patient's Chart, lab work & pertinent test results  Airway Mallampati: I TM Distance: >3 FB Neck ROM: full    Dental no notable dental hx. (+) Teeth Intact   Pulmonary Current Smoker,    Pulmonary exam normal       Cardiovascular negative cardio ROS      Neuro/Psych negative neurological ROS  negative psych ROS   GI/Hepatic negative GI ROS, Neg liver ROS,   Endo/Other  negative endocrine ROS  Renal/GU negative Renal ROS  negative genitourinary   Musculoskeletal negative musculoskeletal ROS (+)   Abdominal Normal abdominal exam  (+)   Peds negative pediatric ROS (+)  Hematology negative hematology ROS (+)   Anesthesia Other Findings   Reproductive/Obstetrics negative OB ROS                           Anesthesia Physical Anesthesia Plan  ASA: II  Anesthesia Plan: General   Post-op Pain Management:    Induction: Intravenous  Airway Management Planned: Oral ETT  Additional Equipment:   Intra-op Plan:   Post-operative Plan: Extubation in OR  Informed Consent: I have reviewed the patients History and Physical, chart, labs and discussed the procedure including the risks, benefits and alternatives for the proposed anesthesia with the patient or authorized representative who has indicated his/her understanding and acceptance.   Dental Advisory Given  Plan Discussed with: CRNA and Surgeon  Anesthesia Plan Comments:         Anesthesia Quick Evaluation

## 2013-04-02 NOTE — Transfer of Care (Signed)
Immediate Anesthesia Transfer of Care Note  Patient: Katherine Holmes  Procedure(s) Performed: Procedure(s): Operative Laparoscopy with Gel Port-Assisted Myomectomy and Excision of Endometriosis (N/A) ASSISTED MYOMECTOMY (N/A) LAPAROSCOPIC LYSIS OF ADHESIONS (N/A)  Patient Location: PACU  Anesthesia Type:General  Level of Consciousness: awake, alert  and oriented  Airway & Oxygen Therapy: Patient Spontanous Breathing and Patient connected to nasal cannula oxygen  Post-op Assessment: Report given to PACU RN, Post -op Vital signs reviewed and stable and Patient moving all extremities  Post vital signs: Reviewed and stable  Complications: No apparent anesthesia complications

## 2013-04-02 NOTE — Preoperative (Signed)
Beta Blockers   Reason not to administer Beta Blockers:Not Applicable 

## 2013-04-02 NOTE — H&P (Signed)
HPI: Katherine Holmes is a 32 y.o. African American premenopausal G1P0010 female presenting to clinic with menorrhagia and dyspareunia.  1. Ultrasound findings  In 2009 while taking a sonography class, she was told an ultrasound demonstrated a cystic teratoma on the left ovary. An ultrasound at [facility] in February 2014 reported a 40.5 mm dermoid cyst on the left ovary along with a uterine fibroid that was hard to distinguish. Her LMP was 4/26 and she reports regular 28 day cycles with heavy bleeding for 7 days that has gotten worse over time. Additional symptoms include dyspareunia.  2. Infertility  For the the past 16 months she and her 32 yo [place] American female partner have had unprotected sex 2 times a week that has not resulted in any pregnancies. She has been pregnant once in 2003 that ended in an abortion. Her partner has never fathered a child and has never had a semen analysis. She has a past history in 2001 of gonorrhea that was treated. Last pap smear was 3/14 and normal.  Review of Systems:  A complete 12-point review of systems was conducted and was negative except as in the HPI  Past Obstetrical History:  G1P0010  TAB in 2003  Fertility History:  Ovulatory: Regular menses, AFC >9 on Left and >6 on Right  Structural: Never evaluated  Genetic: Never evaluated  Female factor Hajar Penninger, [birth year]): never fathered children, no SA  Past Gynecologic History:  Menarche: 32 yo  Menses: 32 yo  Contraception: none  History of STIs: Gonorrhea 2001  Pap: 3/14 normal  Mammogram: never  Past Medical History:  None  Past Surgical History:  None  Family History:  Mother 35 living with Diabetes  Father 61 living  Brothers 38 living  Sister 37 living  Sister 31 deceased gun shot wound  Maternal grandmother deceased colon cancer  Social History:  History    Social History   .  Marital Status:  Single     Spouse Name:  N/A     Number of Children:  N/A   .  Years  of Education:  N/A    Occupational History   .  Not on file.    Social History Main Topics   .  Smoking status:  3 cigarettes/day   .  Smokeless tobacco:  None   .  Alcohol Use:  Occasional   .  Drug Use:  None   .  Sexually Active:  Yes with female partner    Other Topics  Concern   .  Not on file    Social History Narrative   .  No narrative on file    Home Medications:  None  No current outpatient prescriptions on file prior to visit.    No current facility-administered medications on file prior to visit.   Allergies:  None   Physical Exam:  Temp: [98.6 F (37 C)] 98.6 F (37 C)  Pulse: [70] 70  BP: (101)/(61) 101/61 mmHg  Body mass index is 20.36 kg/(m^2).  General Appearance: Well developed, well nourished, no acute distress, alert and oriented x3  Pulmonary: Breathing unlabored  Extremities: Full range of motion, normal gait  Neurologic: Cranial nerves 2-12 grossly intact  Psychiatric: Normal mood and affect, appropriate   Labs/Studies:  No results found for this or any previous visit.  Assessment / Plan:   This is a 32 year old female presenting to clinic with menorrhagia, dyspareunia, and infertility for evaluation of surgical management of recent ultrasound  findings concerning for a fibroid or cyst.  1.Menorrhagia, dyspareunia, previous abnormal ultrasound  Repeat TVUS and SHG performed in clinic today. TVUS with saline SHG demonstrated a right transmural myoma 4cm impinging on the endometrial cavity. No evidence of obstruction within the uterine cavity. Left ovary appeared normal with an antral follicle count of 9. Right ovary appeared normal, AFC >6. There was no concern from this study of a dermoid cyst. The fibroid is likely contributing to her symptoms and will be removed on 04/02/13 by laparoscopy. Findings were discussed with patient and we suspect previous ultrasound performed on a different day of her cycle appeared suspicious. The possibility of  endometriosis with an endometrioid cyst remains and will be evaluated during the laparoscopy.  2. Infertility  No resultant pregnancy after 16 months of unprotected sex. Initial workup with a semen analysis suggested. Will perform chromopertubation at time of surgery. Further workup pending those results and the findings at the surgery will be investigated.   for Laparoscopy, GelPort assisted myomectomy. Benefits and risks were discussed.  Fermin Schwab

## 2013-04-05 ENCOUNTER — Encounter (HOSPITAL_COMMUNITY): Payer: Self-pay | Admitting: Obstetrics and Gynecology

## 2013-04-05 MED FILL — Heparin Sodium (Porcine) Inj 5000 Unit/ML: INTRAMUSCULAR | Qty: 2 | Status: AC

## 2013-05-03 DIAGNOSIS — N809 Endometriosis, unspecified: Secondary | ICD-10-CM | POA: Insufficient documentation

## 2013-08-28 DIAGNOSIS — N978 Female infertility of other origin: Secondary | ICD-10-CM | POA: Insufficient documentation

## 2014-02-24 ENCOUNTER — Ambulatory Visit: Payer: BC Managed Care – PPO | Attending: Orthopaedic Surgery

## 2014-04-29 ENCOUNTER — Other Ambulatory Visit: Payer: Self-pay | Admitting: Obstetrics and Gynecology

## 2014-05-02 LAB — CYTOLOGY - PAP

## 2014-07-11 ENCOUNTER — Emergency Department (HOSPITAL_COMMUNITY)
Admission: EM | Admit: 2014-07-11 | Discharge: 2014-07-11 | Disposition: A | Discharge status: Home / Self Care | Payer: BC Managed Care – PPO | Attending: Emergency Medicine | Admitting: Emergency Medicine

## 2014-07-11 ENCOUNTER — Encounter (HOSPITAL_COMMUNITY): Payer: Self-pay | Admitting: Emergency Medicine

## 2014-07-11 DIAGNOSIS — K089 Disorder of teeth and supporting structures, unspecified: Secondary | ICD-10-CM | POA: Diagnosis present

## 2014-07-11 DIAGNOSIS — Z3202 Encounter for pregnancy test, result negative: Secondary | ICD-10-CM | POA: Diagnosis not present

## 2014-07-11 DIAGNOSIS — Z79899 Other long term (current) drug therapy: Secondary | ICD-10-CM | POA: Diagnosis not present

## 2014-07-11 DIAGNOSIS — F17200 Nicotine dependence, unspecified, uncomplicated: Secondary | ICD-10-CM | POA: Diagnosis not present

## 2014-07-11 DIAGNOSIS — Z792 Long term (current) use of antibiotics: Secondary | ICD-10-CM | POA: Diagnosis not present

## 2014-07-11 DIAGNOSIS — K0889 Other specified disorders of teeth and supporting structures: Secondary | ICD-10-CM

## 2014-07-11 LAB — POC URINE PREG, ED: Preg Test, Ur: NEGATIVE

## 2014-07-11 MED ORDER — HYDROCODONE-ACETAMINOPHEN 5-325 MG PO TABS: 1.0000 | ORAL_TABLET | Freq: Four times a day (QID) | ORAL | Status: DC | PRN

## 2014-07-11 MED ORDER — CLINDAMYCIN HCL 150 MG PO CAPS: 450.0000 mg | ORAL_CAPSULE | Freq: Four times a day (QID) | ORAL | Status: DC

## 2014-07-11 MED ORDER — HYDROCODONE-ACETAMINOPHEN 5-325 MG PO TABS
1.0000 | ORAL_TABLET | Freq: Once | ORAL | Status: AC
  Administered 2014-07-11: 1 via ORAL
  Filled 2014-07-11: qty 1

## 2014-07-11 NOTE — Discharge Instructions (Signed)
Please call your doctor for a followup appointment within 24-48 hours. When you talk to your doctor please let them know that you were seen in the emergency department and have them acquire all of your records so that they can discuss the findings with you and formulate a treatment plan to fully care for your new and ongoing problems. Please call and set-up an appointment with dentist - resources attached Please rest and stay hydrated Please take antibiotics on a full stomach  Please take medications as prescribed - while on pain medications there is to be no drinking alcohol, driving, operating any heavy machinery. If extra please dispose in a proper manner. Please do not take any extra Tylenol with this medication for this can lead to Tylenol overdose and liver issues.  Please apply warm compressions and massage Please continue to monitor symptoms closely and if symptoms are to worsen or change (fever greater than 101, chills, sweating, nausea, vomiting, chest pain, shortness of breathe, difficulty breathing, weakness, numbness, tingling, worsening or changes to pain pattern, swelling, changes to skin color, drainage, bleeding, inability to swallowing, neck pain, neck stiffness) please report back to the Emergency Department immediately.    Dental Pain A tooth ache may be caused by cavities (tooth decay). Cavities expose the nerve of the tooth to air and hot or cold temperatures. It may come from an infection or abscess (also called a boil or furuncle) around your tooth. It is also often caused by dental caries (tooth decay). This causes the pain you are having. DIAGNOSIS  Your caregiver can diagnose this problem by exam. TREATMENT   If caused by an infection, it may be treated with medications which kill germs (antibiotics) and pain medications as prescribed by your caregiver. Take medications as directed.  Only take over-the-counter or prescription medicines for pain, discomfort, or fever as  directed by your caregiver.  Whether the tooth ache today is caused by infection or dental disease, you should see your dentist as soon as possible for further care. SEEK MEDICAL CARE IF: The exam and treatment you received today has been provided on an emergency basis only. This is not a substitute for complete medical or dental care. If your problem worsens or new problems (symptoms) appear, and you are unable to meet with your dentist, call or return to this location. SEEK IMMEDIATE MEDICAL CARE IF:   You have a fever.  You develop redness and swelling of your face, jaw, or neck.  You are unable to open your mouth.  You have severe pain uncontrolled by pain medicine. MAKE SURE YOU:   Understand these instructions.  Will watch your condition.  Will get help right away if you are not doing well or get worse. Document Released: 11/11/2005 Document Revised: 02/03/2012 Document Reviewed: 06/29/2008 [facility] Patient Information 2015 [place], [place]. This information is not intended to replace advice given to you by your health care provider. Make sure you discuss any questions you have with your health care provider.   Emergency Department Resource Guide 1) Find a Doctor and Pay Out of Pocket Although you won't have to find out who is covered by your insurance plan, it is a good idea to ask around and get recommendations. You will then need to call the office and see if the doctor you have chosen will accept you as a new patient and what types of options they offer for patients who are self-pay. Some doctors offer discounts or will set up payment plans for their  patients who do not have insurance, but you will need to ask so you aren't surprised when you get to your appointment.  2) Contact Your Local Health Department Not all health departments have doctors that can see patients for sick visits, but many do, so it is worth a call to see if yours does. If you don't know where your local  health department is, you can check in your phone book. The CDC also has a tool to help you locate your state's health department, and many state websites also have listings of all of their local health departments.  3) Find a [facility] If your illness is not likely to be very severe or complicated, you may want to try a walk in clinic. These are popping up all over the country in pharmacies, drugstores, and shopping centers. They're usually staffed by nurse practitioners or physician assistants that have been trained to treat common illnesses and complaints. They're usually fairly quick and inexpensive. However, if you have serious medical issues or chronic medical problems, these are probably not your best option.  No Primary Care Doctor: - Call Health Connect at  [phone] - they can help you locate a primary care doctor that  accepts your insurance, provides certain services, etc. - Physician Referral Service- [phone]  Chronic Pain Problems: Organization         Address  Phone   Notes  [facility]  [phone] Patients need to be referred by their primary care doctor.   Medication Assistance: Organization         Address  Phone   Notes  [facility] Medication [facility] [address] [place], [place] [address] [phone] --Must be a resident of [facility] -- Must have NO insurance coverage whatsoever (no Medicaid/ Medicare, etc.) -- The pt. MUST have a primary care doctor that directs their care regularly and follows them in the community   MedAssist  [phone]   [employer]  [phone]    Agencies that provide inexpensive medical care: Organization         Address  Phone   Notes  [place]  [phone]   [facility]    [phone]   [facility] [place], [place] [address] [phone]   [place] [address], [place] [phone]   Planned Parenthood    [phone]   [facility]    [phone]   [place] and [place] [address], [place] Phone:  [phone], Fax:  [phone] Hours of Operation:  9 am - 6 pm, M-F.  Also accepts Medicaid/Medicare and self-pay.  [facility] for [place], [address], [place] Phone: [phone], Fax: [phone]. Hours of Operation:  8:30 am - 5:30 pm, M-F.  Also accepts Medicaid and self-pay.  [facility] [place] [address] [place] Phone: [phone]   [place], [place], [place] [phone], Ext. [phone] Mondays & Thursdays: 7-9 AM.  First 15 patients are seen on a first come, first serve basis.    [place] Providers:  Organization         Address  Phone   Notes  [facility] [address], [place] [phone] Also accepts self-pay patients.  Elba Barman  [facility] [place], [place]  [phone]   [place], [address], [place] [phone]   [place] [address] [place] [phone]   Lucianne Lei [address], [place]   [phone] Only accepts [place] Access [place] patients after they have their name applied to their card.   Self-Pay (no insurance) in [facility]:  Organization         Address  Phone   Notes  Sickle Cell Patients, [facility] Internal Medicine [place] [phone]   [facility] [place] [phone]   [facility] [place]  [place], [place], [place] [phone]   [facility]/Dr. Osei-Bonsu  [address] [place] or [address], [place] [phone] Phone number for both [place] and  [place] locations is the same.  [facility] [address] [place] [phone]   [facility] [address], [place] or [address] [phone]   [facility] [address], [place] [phone], phone; [phone], fax Sees patients 1st and 3rd Saturday of every month.  Must not qualify for public or private insurance (i.e. Medicaid, Medicare, [place] Health Choice, Veterans' Benefits)  Household income should be no more than 200% of the poverty level The clinic cannot treat you if you are pregnant or think you are pregnant  Sexually transmitted diseases are not treated at the clinic.    Dental Care: Organization         Address  Phone  Notes  [facility] of [facility] [place] [phone] Accepts children up to age 54 who are enrolled in [place] or [place]; pregnant women with a Medicaid card; and children who have applied for Medicaid or [place] Health Choice, but were declined, whose parents can pay a reduced fee at time of service.  [facility] Department of [facility]  [address], [place] [phone] Accepts children up to age 31 who are enrolled in [place] or [place]; pregnant women with a Medicaid card; and children who have applied for Medicaid or [place] Health Choice, but were declined, whose parents can pay a reduced fee at time of service.  [facility] Adult Dental Access PROGRAM  [place] [phone] Patients are seen by appointment only. Walk-ins are not accepted. [place] will see patients 71 years of age and older. Monday - Tuesday (8am-5pm) Most Wednesdays (8:30-5pm) $30 per visit, cash only  [facility] Adult Dental Access PROGRAM  [address], [facility] [phone] Patients are seen by appointment only. Walk-ins are not accepted.  [place] will see patients 51 years of age and older. One Wednesday Evening (Monthly: Volunteer Based).  $30 per visit, cash only  [place]  [phone] for adults; Children under age 1, call Graduate Pediatric Dentistry at [phone]. Children aged 22-14, please call [phone] to request a pediatric application.  Dental services are provided in all areas of dental care including fillings, crowns and bridges, complete and partial dentures, implants, gum treatment, root canals, and extractions. Preventive care is also provided. Treatment is provided to both adults and  children. Patients are selected via a lottery and there is often a waiting list.   [facility] [address] [place]  [phone] [email or web address]   [facility] [address] [place], [place] [phone], Ext. [phone] Second and Fourth Thursday of each month, opens at 6:30 AM; Clinic ends at 9 AM.  Patients are seen on a first-come first-served basis, and a limited number are seen during each clinic.   [facility]  [address] Hillard Danker [place], [place] [phone]   Eligibility Requirements You must have lived in [place], [place], or [place] counties for at least the last three months.   You cannot be eligible for state or federal sponsored [employer], including [employer], [place], or [employer].   You generally cannot be eligible for healthcare insurance through your employer.    How to apply: Eligibility screenings are held every Tuesday and Wednesday afternoon from 1:00 pm until 4:00 pm. You do not need an appointment for the interview!  [facility] [address] [place], [place]   [place]inger [place]  [place] Department  [place]  [phone]    Behavioral Health Resources in the  Community: Intensive Outpatient Programs Organization         Address  Phone  Notes  [place]. [address], [place], [place] [phone]   [facility] [address] [place], [place]   ADS: Alcohol & Drug Svcs [address] [place], [place]   [place] [address],  [place], [place] or [phone]   Substance Abuse Resources Organization         Address  Phone  Notes  Alcohol and Drug Services  [phone]   [place]  [phone]   The [place]   Chinita Pester  [phone]   Residential & Outpatient Substance Abuse Program  [phone]   Psychological Services Organization         Address  Phone  Notes  [facility] [place]  [place]  [phone]   [place] [address], [place] or [phone]    Mobile Crisis Teams Organization         Address  Phone  Notes  [facility]rapeutic Alternatives, Mobile Crisis Care Unit  [phone]   Assertive Psychotherapeutic Services  [address] [place], [place]   [place] [address] [place] [phone]    Self-Help/Support Groups Organization         Address  Phone             Notes  [place]. of [place] - variety of support groups  [place] Call for more information  Narcotics Anonymous (NA), [facility] [address], [employer] [place]  2 meetings at this location   Special educational needs teacher         Address  Phone  Notes  ASAP Residential Treatment [place],    [place]  [phone]   [facility]  [address] [place] [phone], [place], [place]   [place], [place] [phone] Admissions: 8am-3pm M-F  Incentives Substance [place] [address],    [place], [place] [phone]   [facility]  [facility] [address] [place], [place], [place]   The [place].,  [place], [place]   Insight Programs - Intensive Outpatient [address], [place], [place]   [facility] ([place] [address]  [place], [place] [phone] or [phone]   [facility]idential Treatment Services (RTS) [address] [place], [place] Accepts Medicaid  [facility] [address],  [place] [phone] Substance Abuse/Addiction Treatment   [facility] Organization         Address  Phone  Notes  [facility]  [phone]   Domenic Schwab, PhD [address] Arlis Porta [place], [place]   [phone] or [phone]   [place], [place] [phone]   [address], [place], [place] [phone] Insurance/Medicaid/sponsorship through [facility] and Families [address]                                    [place], [place] [phone] [place] [address] [place], [place] [phone]    Dr. Adele Schilder  [phone]   [facility] Dept. 1) [address],  2) [place] 3)  [address], [place] [phone]  [phone]   [place] [phone] or [phone] (After Hours)

## 2014-07-11 NOTE — ED Provider Notes (Signed)
CSN: [id number]     Arrival date & time 07/11/14  1636 History  This chart was scribed for non-physician practitioner, Jamse Mead, PA-C,working with Virgel Manifold, MD, by Marlowe Kays, ED Scribe. This patient was seen in room WTR6/WTR6 and the patient's care was started at 5:56 PM.  Chief Complaint  Patient presents with  . Dental Pain   Patient is a 33 y.o. female presenting with tooth pain. The history is provided by the patient. No language interpreter was used.  Dental Pain Associated symptoms: no facial swelling, no fever and no neck pain    HPI Comments:  Katherine Holmes is a 33 y.o. female who presents to the Emergency Department complaining of severe throbbing lower right sided dental pain that started two days ago. She states the pain started worsening yesterday. She reports associated gingival swelling of the area. Pt reports she has been taking Ibuprofen with no relief. She denies fever, chills, neck pain, neck stiffness, difficulty swallowing, otalgia, SOB, drainage or bleeding of the gums. She states it has been years since she has seen a dentist. She denies trauma or injury to the tooth or mouth.   Past Medical History  Diagnosis Date  . Medical history non-contributory    Past Surgical History  Procedure Laterality Date  . No past surgeries    . Laparoscopy N/A 04/02/2013    Procedure: Operative Laparoscopy with Gel Port-Assisted Myomectomy and Excision of Endometriosis;  Surgeon: Governor Specking, MD;  Location: [facility];  Service: Gynecology;  Laterality: N/A;  . Myomectomy N/A 04/02/2013    Procedure: ASSISTED MYOMECTOMY;  Surgeon: Governor Specking, MD;  Location: [facility];  Service: Gynecology;  Laterality: N/A;  . Laparoscopic lysis of adhesions N/A 04/02/2013    Procedure: LAPAROSCOPIC LYSIS OF ADHESIONS;  Surgeon: Governor Specking, MD;  Location: [facility];  Service: Gynecology;  Laterality: N/A;   No family history on file. History  Substance Use Topics  .  Smoking status: Current Some Day Smoker -- 0.50 packs/day    Types: Cigarettes  . Smokeless tobacco: Not on file  . Alcohol Use: No   OB History   Grav Para Term Preterm Abortions TAB SAB Ect Mult Living                 Review of Systems  Constitutional: Negative for fever and chills.  HENT: Positive for dental problem. Negative for ear pain, facial swelling and trouble swallowing.   Respiratory: Negative for shortness of breath.   Musculoskeletal: Negative for neck pain and neck stiffness.    Allergies  Review of patient's allergies indicates no known allergies.  Home Medications   Prior to Admission medications   Medication Sig Start Date End Date Taking? Authorizing Provider  clindamycin (CLEOCIN) 150 MG capsule Take 3 capsules (450 mg total) by mouth every 6 (six) hours. 07/11/14   Kindel Rochefort, PA-C  HYDROcodone-acetaminophen (NORCO/VICODIN) 5-325 MG per tablet Take 1 tablet by mouth every 6 (six) hours as needed for moderate pain or severe pain. 07/11/14   Linzie Boursiquot, PA-C  Hydrocodone-Acetaminophen 5-300 MG TABS Take 2 tablets by mouth 4 (four) times daily as needed. 04/02/13   Governor Specking, MD  letrozole (FEMARA) 2.5 MG tablet Take 1 tablet (2.5 mg total) by mouth daily. 04/02/13   Governor Specking, MD  norethindrone (AYGESTIN) 5 MG tablet Take 0.5 tablets (2.5 mg total) by mouth daily. 04/02/13   Governor Specking, MD   Triage Vitals: BP 118/82  Pulse 89  Temp(Src) 97.5 F (36.4 C) (  Oral)  Resp 18  SpO2 100%  LMP 07/03/2014 Physical Exam  Nursing note and vitals reviewed. Constitutional: She is oriented to person, place, and time. She appears well-developed and well-nourished.  HENT:  Head: Normocephalic and atraumatic.  Mouth/Throat:    Right mandibular first molar identified with mild swelling identified with very minimal erythema. Discomfort upon palpation. Negative decay or diagram of the tooth noted. Uvula midline with symmetrical elevation. Negative  uvula swelling. Negative trismus. Negative sublingual lesions.  Eyes: Conjunctivae and EOM are normal. Pupils are equal, round, and reactive to light. Right eye exhibits no discharge. Left eye exhibits no discharge.  Neck: Normal range of motion. Neck supple. No tracheal deviation present.  Negative neck stiffness Negative nuchal rigidity  Negative cervical lymphadenopathy  Negative meningeal signs   Cardiovascular: Normal rate, regular rhythm and normal heart sounds.  Exam reveals no friction rub.   No murmur heard. Pulses:      Radial pulses are 2+ on the right side, and 2+ on the left side.  Cap refill < 3 seconds  Pulmonary/Chest: Effort normal and breath sounds normal. No respiratory distress. She has no wheezes. She has no rales.  Musculoskeletal: Normal range of motion.  Full ROM to upper and lower extremities without difficulty noted, negative ataxia noted.  Lymphadenopathy:    She has no cervical adenopathy.  Neurological: She is alert and oriented to person, place, and time. No cranial nerve deficit. She exhibits normal muscle tone. Coordination normal.  Cranial nerves III-XII grossly intact  Skin: Skin is warm and dry.  Psychiatric: She has a normal mood and affect. Her behavior is normal.    ED Course  Procedures (including critical care time) DIAGNOSTIC STUDIES: Oxygen Saturation is 100% on RA, normal by my interpretation.   COORDINATION OF CARE: 6:01 PM- Will prescribe antibiotic and pain medication. Pt verbalizes understanding and agrees to plan.  Medications  HYDROcodone-acetaminophen (NORCO/VICODIN) 5-325 MG per tablet 1 tablet (1 tablet Oral Given 07/11/14 1816)    Results for orders placed during the hospital encounter of 07/11/14  POC URINE PREG, ED      Result Value Ref Range   Preg Test, Ur NEGATIVE  NEGATIVE     Labs Review Labs Reviewed  POC URINE PREG, ED    Imaging Review No results found.   EKG Interpretation None      MDM   Final  diagnoses:  Pain, dental    Medications  HYDROcodone-acetaminophen (NORCO/VICODIN) 5-325 MG per tablet 1 tablet (1 tablet Oral Given 07/11/14 1816)   I personally performed the services described in this documentation, which was scribed in my presence. The recorded information has been reviewed and is accurate.  Urine pregnancy negative. Doubt ludwig's angina. Doubt peritonsillar abscess. Doubt retropharyngeal abscess. Negative drainable abscess noted. Suspicion to be dental pain secondary to possible wisdom tooth infection. Patient stable, afebrile. Patient not septic appearing. Discharged patient. Discharge patient with pain medications-discussed course, precautions, disposal technique-and antibiotics. Referred patient to dentist. Discussed with patient to apply warm compressions for discomfort relief. Discussed with patient to closely monitor symptoms and if symptoms are to worsen or change to report back to the ED - strict return instructions given.  Patient agreed to plan of care, understood, all questions answered.   Jamse Mead, PA-C 07/11/14 1818

## 2014-07-11 NOTE — ED Notes (Signed)
Pt c/o upper right dental pain, states she needs an antibiotic and pain meds.

## 2014-07-11 NOTE — ED Provider Notes (Signed)
Medical screening examination/treatment/procedure(s) were performed by non-physician practitioner and as supervising physician I was immediately available for consultation/collaboration.   EKG Interpretation None       Virgel Manifold, MD 07/11/14 2321

## 2015-07-25 ENCOUNTER — Emergency Department (HOSPITAL_COMMUNITY)
Admission: EM | Admit: 2015-07-25 | Discharge: 2015-07-25 | Disposition: A | Discharge status: Home / Self Care | Payer: BLUE CROSS/BLUE SHIELD | Attending: Emergency Medicine | Admitting: Emergency Medicine

## 2015-07-25 ENCOUNTER — Encounter (HOSPITAL_COMMUNITY): Payer: Self-pay | Admitting: Emergency Medicine

## 2015-07-25 ENCOUNTER — Emergency Department (HOSPITAL_COMMUNITY): Payer: BLUE CROSS/BLUE SHIELD

## 2015-07-25 DIAGNOSIS — Z79899 Other long term (current) drug therapy: Secondary | ICD-10-CM | POA: Diagnosis not present

## 2015-07-25 DIAGNOSIS — S91051A Open bite, right ankle, initial encounter: Secondary | ICD-10-CM | POA: Diagnosis present

## 2015-07-25 DIAGNOSIS — Z720 Tobacco use: Secondary | ICD-10-CM | POA: Diagnosis not present

## 2015-07-25 DIAGNOSIS — Y9289 Other specified places as the place of occurrence of the external cause: Secondary | ICD-10-CM | POA: Diagnosis not present

## 2015-07-25 DIAGNOSIS — Y939 Activity, unspecified: Secondary | ICD-10-CM | POA: Diagnosis not present

## 2015-07-25 DIAGNOSIS — Y999 Unspecified external cause status: Secondary | ICD-10-CM | POA: Diagnosis not present

## 2015-07-25 DIAGNOSIS — S91032A Puncture wound without foreign body, left ankle, initial encounter: Secondary | ICD-10-CM | POA: Insufficient documentation

## 2015-07-25 DIAGNOSIS — W540XXA Bitten by dog, initial encounter: Secondary | ICD-10-CM | POA: Diagnosis not present

## 2015-07-25 DIAGNOSIS — Z23 Encounter for immunization: Secondary | ICD-10-CM | POA: Diagnosis not present

## 2015-07-25 DIAGNOSIS — T148XXA Other injury of unspecified body region, initial encounter: Secondary | ICD-10-CM

## 2015-07-25 DIAGNOSIS — Z792 Long term (current) use of antibiotics: Secondary | ICD-10-CM | POA: Diagnosis not present

## 2015-07-25 DIAGNOSIS — S91031A Puncture wound without foreign body, right ankle, initial encounter: Secondary | ICD-10-CM | POA: Diagnosis not present

## 2015-07-25 MED ORDER — AMOXICILLIN-POT CLAVULANATE 875-125 MG PO TABS: 1.0000 | ORAL_TABLET | Freq: Two times a day (BID) | ORAL | Status: DC

## 2015-07-25 MED ORDER — TETANUS-DIPHTH-ACELL PERTUSSIS 5-2.5-18.5 LF-MCG/0.5 IM SUSP
0.5000 mL | Freq: Once | INTRAMUSCULAR | Status: AC
  Administered 2015-07-25: 0.5 mL via INTRAMUSCULAR
  Filled 2015-07-25: qty 0.5

## 2015-07-25 NOTE — Discharge Instructions (Signed)

## 2015-07-25 NOTE — ED Notes (Signed)
Patient states was bitten by a known animal around 10:20a.   Patient states she knows the owner and was at her house when friend's dog bit her on both ankles.  Patient states she does not know what kind of dog it was.  Denies other symptoms.

## 2015-07-25 NOTE — ED Notes (Signed)
Declined W/C at D/C and was escorted to lobby by RN. 

## 2015-07-25 NOTE — ED Notes (Signed)
PA at bedside.

## 2015-07-25 NOTE — ED Provider Notes (Signed)
CSN: [id number]     Arrival date & time 07/25/15  1100 History  This chart was scribed for non-physician practitioner, Glendell Docker, NP working with Leo Grosser, MD, by Erling Conte, ED Scribe. This patient was seen in room TR06C/TR06C and the patient's care was started at 11:38 AM.      Chief Complaint  Patient presents with  . Animal Bite    The history is provided by the patient. No language interpreter was used.    HPI Comments: Katherine Holmes is a 34 y.o. female who presents to the Emergency Department complaining of an animal bite to her bilateral ankles onset 1 hour ago. Pt states she was bitten by her friends dog at her home. She reports that she was told the animals vaccinations are UTD but she states she does not trust that information. There is no active bleeding at this time and there is a dressing applied to the area. She denies any other injuries. Pt is unsure when her last tetanus vaccination was. She denies any other symptoms.   Past Medical History  Diagnosis Date  . Medical history non-contributory    Past Surgical History  Procedure Laterality Date  . No past surgeries    . Laparoscopy N/A 04/02/2013    Procedure: Operative Laparoscopy with Gel Port-Assisted Myomectomy and Excision of Endometriosis;  Surgeon: Governor Specking, MD;  Location: [facility];  Service: Gynecology;  Laterality: N/A;  . Myomectomy N/A 04/02/2013    Procedure: ASSISTED MYOMECTOMY;  Surgeon: Governor Specking, MD;  Location: [facility];  Service: Gynecology;  Laterality: N/A;  . Laparoscopic lysis of adhesions N/A 04/02/2013    Procedure: LAPAROSCOPIC LYSIS OF ADHESIONS;  Surgeon: Governor Specking, MD;  Location: [facility];  Service: Gynecology;  Laterality: N/A;   No family history on file. Social History  Substance Use Topics  . Smoking status: Current Some Day Smoker -- 0.50 packs/day    Types: Cigarettes  . Smokeless tobacco: None  . Alcohol Use: No   OB History    No data available      Review of Systems  Constitutional: Negative for fever.  Skin: Positive for wound (dog bites to bilateral ankles. No active bleeding). Negative for rash.  Neurological: Negative for numbness.      Allergies  Review of patient's allergies indicates no known allergies.  Home Medications   Prior to Admission medications   Medication Sig Start Date End Date Taking? Authorizing Provider  clindamycin (CLEOCIN) 150 MG capsule Take 3 capsules (450 mg total) by mouth every 6 (six) hours. 07/11/14   Marissa Sciacca, PA-C  HYDROcodone-acetaminophen (NORCO/VICODIN) 5-325 MG per tablet Take 1 tablet by mouth every 6 (six) hours as needed for moderate pain or severe pain. 07/11/14   Marissa Sciacca, PA-C  Hydrocodone-Acetaminophen 5-300 MG TABS Take 2 tablets by mouth 4 (four) times daily as needed. 04/02/13   Governor Specking, MD  letrozole (FEMARA) 2.5 MG tablet Take 1 tablet (2.5 mg total) by mouth daily. 04/02/13   Governor Specking, MD  norethindrone (AYGESTIN) 5 MG tablet Take 0.5 tablets (2.5 mg total) by mouth daily. 04/02/13   Governor Specking, MD   Triage Vitals: BP 117/68 mmHg  Pulse 106  Temp(Src) 98.6 F (37 C) (Oral)  Resp 16  Ht 5\' 6"  (1.676 m)  Wt 132 lb (59.875 kg)  BMI 21.32 kg/m2  SpO2 99%  LMP 07/23/2015  Physical Exam  Constitutional: She is oriented to person, place, and time. She appears well-developed and well-nourished. No distress.  HENT:  Head: Normocephalic and atraumatic.  Eyes: Conjunctivae and EOM are normal.  Neck: Neck supple. No tracheal deviation present.  Cardiovascular: Normal rate, regular rhythm and normal heart sounds.   Pulmonary/Chest: Effort normal. No respiratory distress.  Musculoskeletal: Normal range of motion.  Neurological: She is alert and oriented to person, place, and time.  Skin: Skin is warm and dry.  2 puncture wounds on right lateral ankle and 2 puncture wounds to left medial ankle  Psychiatric: She has a normal mood and affect. Her  behavior is normal.  Nursing note and vitals reviewed.   ED Course  Procedures (including critical care time)  DIAGNOSTIC STUDIES: Oxygen Saturation is 99% on RA, normal by my interpretation.    COORDINATION OF CARE: 11:43 AM- Pt advised of plan for treatment and pt agrees. Will order tetanus vaccination and x-ray of bilateral ankles to make sure there are no foreign bodies to the wounds. Advised pt I will d/c her home with abx to prevent infection.    Labs Review Labs Reviewed - No data to display  Imaging Review Dg Ankle Complete Left  07/25/2015   CLINICAL DATA:  Hit this morning by dog. Left medial ankle pain and laceration.  EXAM: LEFT ANKLE COMPLETE - 3+ VIEW  COMPARISON:  None.  FINDINGS: No acute bony abnormality. Specifically, no fracture, subluxation, or dislocation. Soft tissues are intact. No radiopaque foreign body. No soft tissue gas.  IMPRESSION: Negative.   Electronically Signed   By: Rolm Baptise M.D.   On: 07/25/2015 12:17   Dg Ankle Complete Right  07/25/2015   CLINICAL DATA:  Pain following dog bite  EXAM: RIGHT ANKLE - COMPLETE 3+ VIEW  COMPARISON:  None.  FINDINGS: Frontal, oblique, and lateral views obtained. There is no fracture or joint effusion. The ankle mortise appears intact. Joint spaces appear intact. No radiopaque foreign body.  IMPRESSION: No abnormality noted.   Electronically Signed   By: Lowella Grip III M.D.   On: 07/25/2015 12:18   I have personally reviewed and evaluated these images and lab results as part of my medical decision-making.   EKG Interpretation None      MDM   Final diagnoses:  Animal bite    No fb noted. Tetanus updated. Put on augmentin. Given return precautions. Don't think rabies is needed as is a friends family dog  I personally performed the services described in this documentation, which was scribed in my presence. The recorded information has been reviewed and is accurate.     Glendell Docker, NP 07/25/15  1259  Leo Grosser, MD 07/26/15 [phone]

## 2015-09-28 ENCOUNTER — Other Ambulatory Visit: Payer: Self-pay | Admitting: Obstetrics and Gynecology

## 2015-09-29 LAB — CYTOLOGY - PAP

## 2015-11-16 ENCOUNTER — Other Ambulatory Visit: Payer: Self-pay | Admitting: Obstetrics and Gynecology

## 2016-07-19 ENCOUNTER — Encounter (HOSPITAL_COMMUNITY): Payer: Self-pay | Admitting: Emergency Medicine

## 2016-07-19 ENCOUNTER — Ambulatory Visit (HOSPITAL_COMMUNITY)
Admission: EM | Admit: 2016-07-19 | Discharge: 2016-07-19 | Disposition: A | Discharge status: Home / Self Care | Payer: BLUE CROSS/BLUE SHIELD | Attending: Family Medicine | Admitting: Family Medicine

## 2016-07-19 DIAGNOSIS — F17210 Nicotine dependence, cigarettes, uncomplicated: Secondary | ICD-10-CM | POA: Insufficient documentation

## 2016-07-19 DIAGNOSIS — N760 Acute vaginitis: Secondary | ICD-10-CM | POA: Insufficient documentation

## 2016-07-19 DIAGNOSIS — B9689 Other specified bacterial agents as the cause of diseases classified elsewhere: Secondary | ICD-10-CM

## 2016-07-19 DIAGNOSIS — A499 Bacterial infection, unspecified: Secondary | ICD-10-CM | POA: Diagnosis not present

## 2016-07-19 MED ORDER — METRONIDAZOLE 500 MG PO TABS: 500.0000 mg | ORAL_TABLET | Freq: Two times a day (BID) | ORAL | 0 refills | Status: DC

## 2016-07-19 NOTE — ED Triage Notes (Signed)
Pt c/o yellowish vag d/c onset yest associated w/foul odor.  Reports a "one night stand" w/no condom use  Voices no other concerns  A&O x4... NAD

## 2016-07-19 NOTE — ED Provider Notes (Signed)
CSN: [id number]     Arrival date & time 07/19/16  1352 History   None    No chief complaint on file.  (Consider location/radiation/quality/duration/timing/severity/associated sxs/prior Treatment) Patient c/o foul smelling vaginal DC.  She denies pelvic pain.    Vaginal Discharge  Quality:  Yellow Severity:  Moderate Onset quality:  Gradual Duration:  3 days Timing:  Constant Progression:  Unchanged Chronicity:  New Context: after intercourse   Relieved by:  Nothing Worsened by:  Nothing Ineffective treatments:  None tried Associated symptoms: vaginal itching   Risk factors: unprotected sex     Past Medical History:  Diagnosis Date  . Medical history non-contributory    Past Surgical History:  Procedure Laterality Date  . LAPAROSCOPIC LYSIS OF ADHESIONS N/A 04/02/2013   Procedure: LAPAROSCOPIC LYSIS OF ADHESIONS;  Surgeon: Governor Specking, MD;  Location: [facility];  Service: Gynecology;  Laterality: N/A;  . LAPAROSCOPY N/A 04/02/2013   Procedure: Operative Laparoscopy with Gel Port-Assisted Myomectomy and Excision of Endometriosis;  Surgeon: Governor Specking, MD;  Location: [facility];  Service: Gynecology;  Laterality: N/A;  . MYOMECTOMY N/A 04/02/2013   Procedure: ASSISTED MYOMECTOMY;  Surgeon: Governor Specking, MD;  Location: [facility];  Service: Gynecology;  Laterality: N/A;  . NO PAST SURGERIES     No family history on file. Social History  Substance Use Topics  . Smoking status: Current Some Day Smoker    Packs/day: 0.50    Types: Cigarettes  . Smokeless tobacco: Not on file  . Alcohol use No   OB History    No data available     Review of Systems  Constitutional: Negative.   HENT: Negative.   Eyes: Negative.   Respiratory: Negative.   Cardiovascular: Negative.   Gastrointestinal: Negative.   Endocrine: Negative.   Genitourinary: Positive for vaginal discharge.  Musculoskeletal: Negative.   Skin: Negative.   Allergic/Immunologic: Negative.   Neurological:  Negative.   Hematological: Negative.   Psychiatric/Behavioral: Negative.     Allergies  Review of patient's allergies indicates no known allergies.  Home Medications   Prior to Admission medications   Medication Sig Start Date End Date Taking? Authorizing Provider  amoxicillin-clavulanate (AUGMENTIN) 875-125 MG per tablet Take 1 tablet by mouth every 12 (twelve) hours. 07/25/15   Glendell Docker, NP  clindamycin (CLEOCIN) 150 MG capsule Take 3 capsules (450 mg total) by mouth every 6 (six) hours. 07/11/14   Marissa Sciacca, PA-C  HYDROcodone-acetaminophen (NORCO/VICODIN) 5-325 MG per tablet Take 1 tablet by mouth every 6 (six) hours as needed for moderate pain or severe pain. 07/11/14   Marissa Sciacca, PA-C  Hydrocodone-Acetaminophen 5-300 MG TABS Take 2 tablets by mouth 4 (four) times daily as needed. 04/02/13   Governor Specking, MD  letrozole (FEMARA) 2.5 MG tablet Take 1 tablet (2.5 mg total) by mouth daily. 04/02/13   Governor Specking, MD  norethindrone (AYGESTIN) 5 MG tablet Take 0.5 tablets (2.5 mg total) by mouth daily. 04/02/13   Governor Specking, MD   Meds Ordered and Administered this Visit  Medications - No data to display  BP 115/59 (BP Location: Left Arm)   Pulse 84   Temp 99.1 F (37.3 C) (Oral)   Resp 12   SpO2 100%  No data found.   Physical Exam  Constitutional: She appears well-developed and well-nourished.  HENT:  Head: Normocephalic and atraumatic.  Eyes: EOM are normal. Pupils are equal, round, and reactive to light.  Cardiovascular: Normal rate, regular rhythm and normal heart sounds.   Pulmonary/Chest:  Effort normal and breath sounds normal.  Genitourinary: Vaginal discharge found.  Genitourinary Comments: BUS wnl Vagina with clear vaginal dc Cervix - No CMT  Nursing note and vitals reviewed.   Urgent Care Course   Clinical Course    Procedures (including critical care time)  Labs Review Labs Reviewed  CERVICOVAGINAL ANCILLARY ONLY    Imaging  Review No results found.   Visual Acuity Review  Right Eye Distance:   Left Eye Distance:   Bilateral Distance:    Right Eye Near:   Left Eye Near:    Bilateral Near:         MDM  Vaginal Discharge BV  Flagyl 500mg  one po bid x 7 days #14 Endocervical cytology - GC chlamydia, trichomonas      Lysbeth Penner, FNP 07/19/16 1459

## 2016-07-22 LAB — CERVICOVAGINAL ANCILLARY ONLY
Chlamydia: NEGATIVE
Neisseria Gonorrhea: NEGATIVE

## 2016-07-23 LAB — CERVICOVAGINAL ANCILLARY ONLY: Wet Prep (BD Affirm): POSITIVE — AB

## 2016-07-25 ENCOUNTER — Telehealth (HOSPITAL_COMMUNITY): Payer: Self-pay | Admitting: Emergency Medicine

## 2016-07-25 NOTE — Telephone Encounter (Signed)
-----   Message from Sherlene Shams, MD sent at 07/24/2016  4:15 PM EDT ----- Please let patient know that test for gardnerella (bacterial vaginosis) was positive.  Rx metronidazole was given at [facility] visit 07/19/16.  Recheck or followup PCP/Michelle Philis Pique for further evaluation if symptoms persist.  LM

## 2016-07-25 NOTE — Telephone Encounter (Signed)
LM on pt's VM [phone] Need to see how pt is doing and to give lab results from recent visit on 8/25 Also let pt know labs can be obtained from [place]

## 2016-07-29 NOTE — Telephone Encounter (Signed)
Called pt and notified of recent lab results Pt ID'd properly... Reports feeling better and sx have subsided Adv pt if sx are not getting better to return or to f/u w/PCP Education on safe sex given Pt verb understanding.

## 2016-12-19 ENCOUNTER — Other Ambulatory Visit: Payer: Self-pay | Admitting: Obstetrics and Gynecology

## 2016-12-19 DIAGNOSIS — Z6820 Body mass index (BMI) 20.0-20.9, adult: Secondary | ICD-10-CM | POA: Diagnosis not present

## 2016-12-19 DIAGNOSIS — Z01419 Encounter for gynecological examination (general) (routine) without abnormal findings: Secondary | ICD-10-CM | POA: Diagnosis not present

## 2016-12-19 DIAGNOSIS — Z124 Encounter for screening for malignant neoplasm of cervix: Secondary | ICD-10-CM | POA: Diagnosis not present

## 2016-12-20 LAB — CYTOLOGY - PAP

## 2017-02-22 ENCOUNTER — Emergency Department (HOSPITAL_COMMUNITY)
Admission: EM | Admit: 2017-02-22 | Discharge: 2017-02-23 | Disposition: A | Discharge status: Home / Self Care | Payer: 59 | Attending: Emergency Medicine | Admitting: Emergency Medicine

## 2017-02-22 ENCOUNTER — Encounter (HOSPITAL_COMMUNITY): Payer: Self-pay | Admitting: Vascular Surgery

## 2017-02-22 DIAGNOSIS — Z711 Person with feared health complaint in whom no diagnosis is made: Secondary | ICD-10-CM

## 2017-02-22 DIAGNOSIS — Z202 Contact with and (suspected) exposure to infections with a predominantly sexual mode of transmission: Secondary | ICD-10-CM | POA: Insufficient documentation

## 2017-02-22 DIAGNOSIS — F17210 Nicotine dependence, cigarettes, uncomplicated: Secondary | ICD-10-CM | POA: Diagnosis not present

## 2017-02-22 DIAGNOSIS — B9689 Other specified bacterial agents as the cause of diseases classified elsewhere: Secondary | ICD-10-CM | POA: Diagnosis not present

## 2017-02-22 DIAGNOSIS — N898 Other specified noninflammatory disorders of vagina: Secondary | ICD-10-CM | POA: Diagnosis present

## 2017-02-22 DIAGNOSIS — N760 Acute vaginitis: Secondary | ICD-10-CM | POA: Diagnosis not present

## 2017-02-22 LAB — URINALYSIS, ROUTINE W REFLEX MICROSCOPIC
Bilirubin Urine: NEGATIVE
GLUCOSE, UA: NEGATIVE mg/dL
Hgb urine dipstick: NEGATIVE
KETONES UR: NEGATIVE mg/dL
Nitrite: NEGATIVE
PH: 7 (ref 5.0–8.0)
Protein, ur: NEGATIVE mg/dL
Specific Gravity, Urine: 1.008 (ref 1.005–1.030)

## 2017-02-22 LAB — POC URINE PREG, ED: Preg Test, Ur: NEGATIVE

## 2017-02-22 NOTE — ED Triage Notes (Signed)
Pt reports to the ED for eval of vaginal pain/irritation. She states that she had rough sex on Wednesday and she states that she has had vaginal irritation, pain, and some clear discharge. Pt did have unprotected sex. She denies any abd pain. She has some dysuria but denies any frequency or urgency.

## 2017-02-23 DIAGNOSIS — F17210 Nicotine dependence, cigarettes, uncomplicated: Secondary | ICD-10-CM | POA: Diagnosis not present

## 2017-02-23 DIAGNOSIS — N760 Acute vaginitis: Secondary | ICD-10-CM | POA: Diagnosis not present

## 2017-02-23 DIAGNOSIS — Z202 Contact with and (suspected) exposure to infections with a predominantly sexual mode of transmission: Secondary | ICD-10-CM | POA: Diagnosis not present

## 2017-02-23 LAB — WET PREP, GENITAL
SPERM: NONE SEEN
Trich, Wet Prep: NONE SEEN
Yeast Wet Prep HPF POC: NONE SEEN

## 2017-02-23 MED ORDER — CEFTRIAXONE SODIUM 250 MG IJ SOLR
250.0000 mg | Freq: Once | INTRAMUSCULAR | Status: AC
  Administered 2017-02-23: 250 mg via INTRAMUSCULAR
  Filled 2017-02-23: qty 250

## 2017-02-23 MED ORDER — METRONIDAZOLE 500 MG PO TABS: 500.0000 mg | ORAL_TABLET | Freq: Two times a day (BID) | ORAL | 0 refills | Status: DC

## 2017-02-23 MED ORDER — LIDOCAINE HCL (PF) 1 % IJ SOLN
5.0000 mL | Freq: Once | INTRAMUSCULAR | Status: AC
  Administered 2017-02-23: 5 mL
  Filled 2017-02-23: qty 5

## 2017-02-23 MED ORDER — AZITHROMYCIN 250 MG PO TABS
1000.0000 mg | ORAL_TABLET | Freq: Once | ORAL | Status: AC
  Administered 2017-02-23: 1000 mg via ORAL
  Filled 2017-02-23: qty 4

## 2017-02-23 MED ORDER — VALACYCLOVIR HCL 1 G PO TABS: 1000.0000 mg | ORAL_TABLET | Freq: Two times a day (BID) | ORAL | 0 refills | Status: AC

## 2017-02-23 NOTE — ED Provider Notes (Signed)
[facility] DEPT Provider Note   CSN: [id number] Arrival date & time: 02/22/17  2304     History   Chief Complaint Chief Complaint  Patient presents with  . Vaginal Discharge    HPI Katherine Holmes is a 36 y.o. female.  Patient presents emergency department with chief complaint of vaginal pain and vaginal discharge. She reports that she had rough intercourse on Wednesday. She states that she has had some irritation and pain since then. She also reports some clear vaginal discharge. She denies any fevers, chills, nausea, or vomiting. Denies any abdominal or pelvic pain. She states she has had some dysuria, but denies any urgency or frequency of urination.   The history is provided by the patient. No language interpreter was used.    Past Medical History:  Diagnosis Date  . Medical history non-contributory     There are no active problems to display for this patient.   Past Surgical History:  Procedure Laterality Date  . LAPAROSCOPIC LYSIS OF ADHESIONS N/A 04/02/2013   Procedure: LAPAROSCOPIC LYSIS OF ADHESIONS;  Surgeon: Governor Specking, MD;  Location: [facility];  Service: Gynecology;  Laterality: N/A;  . LAPAROSCOPY N/A 04/02/2013   Procedure: Operative Laparoscopy with Gel Port-Assisted Myomectomy and Excision of Endometriosis;  Surgeon: Governor Specking, MD;  Location: [facility];  Service: Gynecology;  Laterality: N/A;  . MYOMECTOMY N/A 04/02/2013   Procedure: ASSISTED MYOMECTOMY;  Surgeon: Governor Specking, MD;  Location: [facility];  Service: Gynecology;  Laterality: N/A;  . NO PAST SURGERIES      OB History    No data available       Home Medications    Prior to Admission medications   Medication Sig Start Date End Date Taking? Authorizing Provider  amoxicillin-clavulanate (AUGMENTIN) 875-125 MG per tablet Take 1 tablet by mouth every 12 (twelve) hours. 07/25/15   Glendell Docker, NP  clindamycin (CLEOCIN) 150 MG capsule Take 3 capsules (450 mg total) by mouth every 6  (six) hours. 07/11/14   Marissa Sciacca, PA-C  HYDROcodone-acetaminophen (NORCO/VICODIN) 5-325 MG per tablet Take 1 tablet by mouth every 6 (six) hours as needed for moderate pain or severe pain. 07/11/14   Marissa Sciacca, PA-C  Hydrocodone-Acetaminophen 5-300 MG TABS Take 2 tablets by mouth 4 (four) times daily as needed. 04/02/13   Governor Specking, MD  letrozole (FEMARA) 2.5 MG tablet Take 1 tablet (2.5 mg total) by mouth daily. 04/02/13   Governor Specking, MD  metroNIDAZOLE (FLAGYL) 500 MG tablet Take 1 tablet (500 mg total) by mouth 2 (two) times daily. 07/19/16   Lysbeth Penner, FNP  norethindrone (AYGESTIN) 5 MG tablet Take 0.5 tablets (2.5 mg total) by mouth daily. 04/02/13   Governor Specking, MD    Family History History reviewed. No pertinent family history.  Social History Social History  Substance Use Topics  . Smoking status: Current Every Day Smoker    Packs/day: 0.50    Types: Cigarettes  . Smokeless tobacco: Never Used  . Alcohol use Yes     Comment: occasionally     Allergies   Patient has no known allergies.   Review of Systems Review of Systems  Genitourinary: Positive for vaginal discharge and vaginal pain.  All other systems reviewed and are negative.    Physical Exam Updated Vital Signs BP 108/89   Pulse 92   Temp 97.8 F (36.6 C) (Oral)   Resp 16   SpO2 99%   Physical Exam  Constitutional: She is oriented to person, place, and  time. She appears well-developed and well-nourished.  HENT:  Head: Normocephalic and atraumatic.  Eyes: Conjunctivae and EOM are normal. Pupils are equal, round, and reactive to light.  Neck: Normal range of motion. Neck supple.  Cardiovascular: Normal rate and regular rhythm.  Exam reveals no gallop and no friction rub.   No murmur heard. Pulmonary/Chest: Effort normal and breath sounds normal. No respiratory distress. She has no wheezes. She has no rales. She exhibits no tenderness.  Abdominal: Soft. Bowel sounds are  normal. She exhibits no distension and no mass. There is no tenderness. There is no rebound and no guarding.  Genitourinary:  Genitourinary Comments: Chaperone present for pelvic exam, patient has runny white vaginal discharge, as well as several small lesions on the external genitalia which appear to be consistent with herpes lesions  Musculoskeletal: Normal range of motion. She exhibits no edema or tenderness.  Neurological: She is alert and oriented to person, place, and time.  Skin: Skin is warm and dry.  Psychiatric: She has a normal mood and affect. Her behavior is normal. Judgment and thought content normal.  Nursing note and vitals reviewed.    ED Treatments / Results  Labs (all labs ordered are listed, but only abnormal results are displayed) Labs Reviewed  URINALYSIS, ROUTINE W REFLEX MICROSCOPIC - Abnormal; Notable for the following:       Result Value   Color, Urine STRAW (*)    APPearance HAZY (*)    Leukocytes, UA LARGE (*)    Bacteria, UA RARE (*)    Squamous Epithelial / LPF 0-5 (*)    All other components within normal limits  WET PREP, GENITAL  POC URINE PREG, ED  GC/CHLAMYDIA PROBE AMP ([place]) NOT AT [facility]    EKG  EKG Interpretation None       Radiology No results found.  Procedures Procedures (including critical care time)  Medications Ordered in ED Medications  cefTRIAXone (ROCEPHIN) injection 250 mg (not administered)  azithromycin (ZITHROMAX) tablet 1,000 mg (not administered)     Initial Impression / Assessment and Plan / ED Course  I have reviewed the triage vital signs and the nursing notes.  Pertinent labs & imaging results that were available during my care of the patient were reviewed by me and considered in my medical decision making (see chart for details).     Patient with vaginal discharge, and several small vesicular lesions on the external genitalia. These lesions are exquisitely tender to palpation. I suspect that this  also causes the patient's painful urination as the urine runs over the lesions. Will treat the patient for STDs. Recommend follow-up with the health department or her PCP or OB/GYN.  Final Clinical Impressions(s) / ED Diagnoses   Final diagnoses:  BV (bacterial vaginosis)  Concern about STD in female without diagnosis    New Prescriptions New Prescriptions   METRONIDAZOLE (FLAGYL) 500 MG TABLET    Take 1 tablet (500 mg total) by mouth 2 (two) times daily.   VALACYCLOVIR (VALTREX) 1000 MG TABLET    Take 1 tablet (1,000 mg total) by mouth 2 (two) times daily.     Montine Circle, PA-C 02/23/17 0136    Merryl Hacker, MD 02/23/17 [phone]

## 2017-02-23 NOTE — Discharge Instructions (Signed)
You have several small lesions that could be herpes.  You have been given treatment for this.  Please follow-up with your doctor or the health department.

## 2017-02-24 LAB — GC/CHLAMYDIA PROBE AMP (~~LOC~~) NOT AT ARMC
Chlamydia: NEGATIVE
Neisseria Gonorrhea: NEGATIVE

## 2017-02-26 ENCOUNTER — Other Ambulatory Visit: Payer: Self-pay | Admitting: Obstetrics and Gynecology

## 2017-02-26 DIAGNOSIS — Z113 Encounter for screening for infections with a predominantly sexual mode of transmission: Secondary | ICD-10-CM | POA: Diagnosis not present

## 2017-02-26 DIAGNOSIS — A609 Anogenital herpesviral infection, unspecified: Secondary | ICD-10-CM | POA: Diagnosis not present

## 2017-02-26 DIAGNOSIS — N764 Abscess of vulva: Secondary | ICD-10-CM | POA: Diagnosis not present

## 2017-06-12 DIAGNOSIS — L700 Acne vulgaris: Secondary | ICD-10-CM | POA: Diagnosis not present

## 2017-06-12 DIAGNOSIS — Z79899 Other long term (current) drug therapy: Secondary | ICD-10-CM | POA: Diagnosis not present

## 2017-06-16 ENCOUNTER — Encounter (HOSPITAL_COMMUNITY): Payer: Self-pay | Admitting: Nurse Practitioner

## 2017-06-16 ENCOUNTER — Emergency Department (HOSPITAL_COMMUNITY): Payer: 59

## 2017-06-16 ENCOUNTER — Emergency Department (HOSPITAL_COMMUNITY)
Admission: EM | Admit: 2017-06-16 | Discharge: 2017-06-17 | Disposition: A | Discharge status: Home / Self Care | Payer: 59 | Attending: Emergency Medicine | Admitting: Emergency Medicine

## 2017-06-16 DIAGNOSIS — Z79899 Other long term (current) drug therapy: Secondary | ICD-10-CM | POA: Diagnosis not present

## 2017-06-16 DIAGNOSIS — D251 Intramural leiomyoma of uterus: Secondary | ICD-10-CM | POA: Diagnosis not present

## 2017-06-16 DIAGNOSIS — F17210 Nicotine dependence, cigarettes, uncomplicated: Secondary | ICD-10-CM | POA: Diagnosis not present

## 2017-06-16 DIAGNOSIS — N938 Other specified abnormal uterine and vaginal bleeding: Secondary | ICD-10-CM | POA: Diagnosis not present

## 2017-06-16 DIAGNOSIS — D259 Leiomyoma of uterus, unspecified: Secondary | ICD-10-CM | POA: Insufficient documentation

## 2017-06-16 DIAGNOSIS — Z8742 Personal history of other diseases of the female genital tract: Secondary | ICD-10-CM | POA: Diagnosis not present

## 2017-06-16 DIAGNOSIS — R102 Pelvic and perineal pain: Secondary | ICD-10-CM

## 2017-06-16 HISTORY — DX: Endometriosis, unspecified: N80.9

## 2017-06-16 LAB — I-STAT BETA HCG BLOOD, ED (MC, WL, AP ONLY): I-stat hCG, quantitative: <5 m[IU]/mL (ref <5)

## 2017-06-16 LAB — CBC WITH DIFFERENTIAL/PLATELET
BASOS ABS: 0 10*3/uL (ref 0.0–0.1)
BASOS PCT: 0 %
EOS ABS: 0.1 10*3/uL (ref 0.0–0.7)
Eosinophils Relative: 2 %
HCT: 43.4 % (ref 36.0–46.0)
HEMOGLOBIN: 15 g/dL (ref 12.0–15.0)
LYMPHS ABS: 3.5 10*3/uL (ref 0.7–4.0)
Lymphocytes Relative: 52 %
MCH: 33.9 pg (ref 26.0–34.0)
MCHC: 34.6 g/dL (ref 30.0–36.0)
MCV: 98.2 fL (ref 78.0–100.0)
Monocytes Absolute: 0.2 10*3/uL (ref 0.1–1.0)
Monocytes Relative: 3 %
NEUTROS PCT: 43 %
Neutro Abs: 2.9 10*3/uL (ref 1.7–7.7)
PLATELETS: 190 10*3/uL (ref 150–400)
RBC: 4.42 MIL/uL (ref 3.87–5.11)
RDW: 12.8 % (ref 11.5–15.5)
WBC: 6.7 10*3/uL (ref 4.0–10.5)

## 2017-06-16 LAB — BASIC METABOLIC PANEL
ANION GAP: 9 (ref 5–15)
BUN: 7 mg/dL (ref 6–20)
CHLORIDE: 105 mmol/L (ref 101–111)
CO2: 23 mmol/L (ref 22–32)
Calcium: 9.4 mg/dL (ref 8.9–10.3)
Creatinine, Ser: 0.61 mg/dL (ref 0.44–1.00)
GFR calc non Af Amer: >60 mL/min (ref >60)
Glucose, Bld: 101 mg/dL — ABNORMAL HIGH (ref 65–99)
POTASSIUM: 3.9 mmol/L (ref 3.5–5.1)
SODIUM: 137 mmol/L (ref 135–145)

## 2017-06-16 MED ORDER — ACETAMINOPHEN 500 MG PO TABS
1000.0000 mg | ORAL_TABLET | Freq: Once | ORAL | Status: AC
  Administered 2017-06-16: 1000 mg via ORAL
  Filled 2017-06-16: qty 2

## 2017-06-16 MED ORDER — HYDROCODONE-ACETAMINOPHEN 5-325 MG PO TABS: 1.0000 | ORAL_TABLET | ORAL | 0 refills | Status: DC | PRN

## 2017-06-16 NOTE — Discharge Instructions (Signed)
Call your doctor to set an appointment for further management of continuous bleeding.

## 2017-06-16 NOTE — ED Notes (Signed)
Patient transported to Ultrasound 

## 2017-06-16 NOTE — ED Triage Notes (Signed)
Pt presents with c/o abnormal vaginal bleeding. She reports the bleeding began ten days ago and has been heavy almost every day. Her OBGYN prescribed estradiol for the bleeding and the patient did take the estradiol for three days with some decrease in bleeding. She c/o fatigue, pelvic cramps, foul vaginal odor. She had an IUD placed in 2016 for endometriosis and reports irregular and periods since.

## 2017-06-16 NOTE — ED Provider Notes (Signed)
[facility] DEPT Provider Note   CSN: [id number] Arrival date & time: 06/16/17  1815     History   Chief Complaint Chief Complaint  Patient presents with  . Vaginal Bleeding    HPI Katherine Holmes is a 35 y.o. female.  Patient with a history of endometriosis presents with complaint of vaginal bleeding x 2 weeks. She reports a heavy flow and left pelvic pain that is intermittent, severe when present and cramping. No fever, dysuria, lightheadedness, syncope or near syncope. She has an IUD placed in 2016 to help with DUB and she reports it has been controlled with the exception of an episode of bleeding for which her OB/GYN gave her estradiol that resolved the bleeding. She had a refill and reports she took it for 3 days this past week with improvement but did not continue to medication.   The history is provided by the patient. No language interpreter was used.  Vaginal Bleeding  Primary symptoms include pelvic pain, vaginal bleeding.  Primary symptoms include no dysuria. Pertinent negatives include no nausea, no vomiting and no light-headedness.    Past Medical History:  Diagnosis Date  . Endometriosis   . Medical history non-contributory     There are no active problems to display for this patient.   Past Surgical History:  Procedure Laterality Date  . LAPAROSCOPIC LYSIS OF ADHESIONS N/A 04/02/2013   Procedure: LAPAROSCOPIC LYSIS OF ADHESIONS;  Surgeon: Governor Specking, MD;  Location: [facility];  Service: Gynecology;  Laterality: N/A;  . LAPAROSCOPY N/A 04/02/2013   Procedure: Operative Laparoscopy with Gel Port-Assisted Myomectomy and Excision of Endometriosis;  Surgeon: Governor Specking, MD;  Location: [facility];  Service: Gynecology;  Laterality: N/A;  . MYOMECTOMY N/A 04/02/2013   Procedure: ASSISTED MYOMECTOMY;  Surgeon: Governor Specking, MD;  Location: [facility];  Service: Gynecology;  Laterality: N/A;  . NO PAST SURGERIES      OB History    No data available        Home Medications    Prior to Admission medications   Medication Sig Start Date End Date Taking? Authorizing Provider  estradiol (ESTRACE) 1 MG tablet Take 1 mg by mouth daily as needed (menstration).   Yes [provider]  levonorgestrel (MIRENA) 20 MCG/24HR IUD 1 each by Intrauterine route once.   Yes [provider]  spironolactone (ALDACTONE) 50 MG tablet Take 50 mg by mouth daily.   Yes [provider]    Family History History reviewed. No pertinent family history.  Social History Social History  Substance Use Topics  . Smoking status: Current Every Day Smoker    Packs/day: 0.50    Types: Cigarettes  . Smokeless tobacco: Never Used  . Alcohol use Yes     Comment: occasionally     Allergies   Patient has no known allergies.   Review of Systems Review of Systems  Constitutional: Negative for chills and fever.  Respiratory: Negative.   Cardiovascular: Negative.   Gastrointestinal: Negative.  Negative for nausea and vomiting.  Genitourinary: Positive for pelvic pain and vaginal bleeding. Negative for dysuria.  Musculoskeletal: Negative.   Skin: Negative.  Negative for pallor.  Neurological: Negative.  Negative for syncope, weakness and light-headedness.     Physical Exam Updated Vital Signs BP 109/71   Pulse 86   Temp 98.1 F (36.7 C) (Oral)   Resp 17   SpO2 100%   Physical Exam  Constitutional: She is oriented to person, place, and time. She appears well-developed and well-nourished.  HENT:  Head: Normocephalic.  Neck: Normal range of motion. Neck supple.  Cardiovascular: Normal rate.   Pulmonary/Chest: Effort normal.  Abdominal: Soft. Bowel sounds are normal. There is no tenderness (No tenderness currently.). There is no rebound and no guarding.  Genitourinary:  Genitourinary Comments: The patient declined a pelvic exam.   Musculoskeletal: Normal range of motion.  Neurological: She is alert and oriented to person,  place, and time.  Skin: Skin is warm and dry. No rash noted.  Psychiatric: She has a normal mood and affect.     ED Treatments / Results  Labs (all labs ordered are listed, but only abnormal results are displayed) Labs Reviewed  CBC WITH DIFFERENTIAL/PLATELET  BASIC METABOLIC PANEL  I-STAT BETA HCG BLOOD, ED ([facility], [facility], [facility] ONLY)   Results for orders placed or performed during the hospital encounter of 06/16/17  CBC with Differential  Result Value Ref Range   WBC 6.7 4.0 - 10.5 K/uL   RBC 4.42 3.87 - 5.11 MIL/uL   Hemoglobin 15.0 12.0 - 15.0 g/dL   HCT 43.4 36.0 - 46.0 %   MCV 98.2 78.0 - 100.0 fL   MCH 33.9 26.0 - 34.0 pg   MCHC 34.6 30.0 - 36.0 g/dL   RDW 12.8 11.5 - 15.5 %   Platelets 190 150 - 400 K/uL   Neutrophils Relative % 43 %   Neutro Abs 2.9 1.7 - 7.7 K/uL   Lymphocytes Relative 52 %   Lymphs Abs 3.5 0.7 - 4.0 K/uL   Monocytes Relative 3 %   Monocytes Absolute 0.2 0.1 - 1.0 K/uL   Eosinophils Relative 2 %   Eosinophils Absolute 0.1 0.0 - 0.7 K/uL   Basophils Relative 0 %   Basophils Absolute 0.0 0.0 - 0.1 K/uL  Basic metabolic panel  Result Value Ref Range   Sodium 137 135 - 145 mmol/L   Potassium 3.9 3.5 - 5.1 mmol/L   Chloride 105 101 - 111 mmol/L   CO2 23 22 - 32 mmol/L   Glucose, Bld 101 (H) 65 - 99 mg/dL   BUN 7 6 - 20 mg/dL   Creatinine, Ser 0.61 0.44 - 1.00 mg/dL   Calcium 9.4 8.9 - 10.3 mg/dL   GFR calc non Af Amer >60 >60 mL/min   GFR calc Af Amer >60 >60 mL/min   Anion gap 9 5 - 15  I-Stat beta hCG blood, ED  Result Value Ref Range   I-stat hCG, quantitative <5.0 <5 mIU/mL   Comment 3            EKG  EKG Interpretation None       Radiology No results found.  Procedures Procedures (including critical care time)  Medications Ordered in ED Medications  acetaminophen (TYLENOL) tablet 1,000 mg (1,000 mg Oral Given 06/16/17 2224)     Initial Impression / Assessment and Plan / ED Course  I have reviewed the triage vital signs and the  nursing notes.  Pertinent labs & imaging results that were available during my care of the patient were reviewed by me and considered in my medical decision making (see chart for details).     Patient with heavy vaginal bleeding x 2 weeks, history of endometriosis and IUD placement to control symptoms of DUB. She reports left pelvic pain that is intermittent and severe.  She did not want to undergo a pelvic examination. She expresses concern for the amount of bleeding she is having and for left adnexal pain. Will check CBC for  hemoglobin and pelvic US to r/o torsion.   US unremarkable except for 1.4 x 1.4 cm uterine fibroid. No torsion of left ovary. IUD appropriately placed. Hgb normal at 15.0.  She is stable for discharge home and is encouraged to see her doctor for further evaluation.  Final Clinical Impressions(s) / ED Diagnoses   Final diagnoses:  Pelvic pain  Pelvic pain   1. Dysfunctional uterine bleeding 2. Uterine fibroid 3. History of endometriosis  New Prescriptions New Prescriptions   No medications on file     Dennie Bible 06/17/17 0007    Julianne Rice, MD 06/19/17 2124

## 2017-06-17 NOTE — ED Notes (Signed)
ED Provider at bedside. 

## 2017-06-24 DIAGNOSIS — N803 Endometriosis of pelvic peritoneum: Secondary | ICD-10-CM | POA: Diagnosis not present

## 2017-06-24 DIAGNOSIS — A609 Anogenital herpesviral infection, unspecified: Secondary | ICD-10-CM | POA: Diagnosis not present

## 2017-07-02 DIAGNOSIS — Z30432 Encounter for removal of intrauterine contraceptive device: Secondary | ICD-10-CM | POA: Diagnosis not present

## 2017-07-02 DIAGNOSIS — Z3042 Encounter for surveillance of injectable contraceptive: Secondary | ICD-10-CM | POA: Diagnosis not present

## 2017-09-24 DIAGNOSIS — Z3042 Encounter for surveillance of injectable contraceptive: Secondary | ICD-10-CM | POA: Diagnosis not present

## 2017-12-16 DIAGNOSIS — N920 Excessive and frequent menstruation with regular cycle: Secondary | ICD-10-CM | POA: Diagnosis not present

## 2017-12-17 DIAGNOSIS — Z3042 Encounter for surveillance of injectable contraceptive: Secondary | ICD-10-CM | POA: Diagnosis not present

## 2017-12-19 MED FILL — BUPROPION SR 150 MG TABLET: 150 | 15 days supply | Qty: 30 | Fill #0

## 2018-01-14 NOTE — Progress Notes (Signed)
Chief Complaint  Patient presents with  . Annual Exam    Subjective:  Katherine Holmes is a 37 y.o. female here for a health maintenance visit.  Patient is NEW pt  She is getting evaluated for an adoption program She would like to get a form filled out and to quit smoking She smokes a pack per day She tried wellbutrin but had very bizzare painful dreams that have affected her negatively. She relives trauma and sees dead family members in her dreams.  There are no active problems to display for this patient.   Past Medical History:  Diagnosis Date  . Anemia   . Endometriosis   . Medical history non-contributory     Past Surgical History:  Procedure Laterality Date  . LAPAROSCOPIC LYSIS OF ADHESIONS N/A 04/02/2013   Procedure: LAPAROSCOPIC LYSIS OF ADHESIONS;  Surgeon: Governor Specking, MD;  Location: [facility];  Service: Gynecology;  Laterality: N/A;  . LAPAROSCOPY N/A 04/02/2013   Procedure: Operative Laparoscopy with Gel Port-Assisted Myomectomy and Excision of Endometriosis;  Surgeon: Governor Specking, MD;  Location: [facility];  Service: Gynecology;  Laterality: N/A;  . MYOMECTOMY N/A 04/02/2013   Procedure: ASSISTED MYOMECTOMY;  Surgeon: Governor Specking, MD;  Location: [facility];  Service: Gynecology;  Laterality: N/A;  . NO PAST SURGERIES       Outpatient Medications Prior to Visit  Medication Sig Dispense Refill  . acyclovir (ZOVIRAX) 400 MG tablet Take 400 mg by mouth 2 (two) times daily.    . medroxyPROGESTERone (DEPO-PROVERA) 150 MG/ML injection Inject 150 mg into the muscle every 3 (three) months.    Marland Kitchen spironolactone (ALDACTONE) 50 MG tablet Take 50 mg by mouth daily.    Marland Kitchen estradiol (ESTRACE) 1 MG tablet Take 1 mg by mouth daily as needed (menstration).    Marland Kitchen HYDROcodone-acetaminophen (NORCO/VICODIN) 5-325 MG tablet Take 1 tablet by mouth every 4 (four) hours as needed. (Patient not taking: Reported on 01/15/2018) 5 tablet 0  . levonorgestrel (MIRENA) 20 MCG/24HR IUD 1 each by  Intrauterine route once.     No facility-administered medications prior to visit.     No Known Allergies   Family History  Problem Relation Age of Onset  . Diabetes Mother   . Hypertension Mother   . Hypertension Father   . Hypertension Sister   . Cancer Maternal Grandmother      Social History   Socioeconomic History  . Marital status: Widowed    Spouse name: Not on file  . Number of children: Not on file  . Years of education: Not on file  . Highest education level: Not on file  Social Needs  . Financial resource strain: Not on file  . Food insecurity - worry: Not on file  . Food insecurity - inability: Not on file  . Transportation needs - medical: Not on file  . Transportation needs - non-medical: Not on file  Occupational History  . Not on file  Tobacco Use  . Smoking status: Current Every Day Smoker    Packs/day: 0.50    Types: Cigarettes  . Smokeless tobacco: Never Used  Substance and Sexual Activity  . Alcohol use: Yes    Comment: occasionally  . Drug use: No  . Sexual activity: Not on file  Other Topics Concern  . Not on file  Social History Narrative  . Not on file   Social History   Substance and Sexual Activity  Alcohol Use Yes   Comment: occasionally   Social History  Tobacco Use  Smoking Status Current Every Day Smoker  . Packs/day: 0.50  . Types: Cigarettes  Smokeless Tobacco Never Used   Social History   Substance and Sexual Activity  Drug Use No    GYN: Sexual Health Menstrual status: regular menses LMP: Patient's last menstrual period was 01/15/2018. Last pap smear: see HM section History of abnormal pap smears:    Health Maintenance: See under health Maintenance activity for review of completion dates as well. Immunization History  Administered Date(s) Administered  . Tdap 07/25/2015      Depression Screen-PHQ2/9 Depression screen PHQ 2/9 01/15/2018  Decreased Interest 0  Down, Depressed, Hopeless 0  PHQ - 2  Score 0       Depression Severity and Treatment Recommendations:  0-4= None  5-9= Mild / Treatment: Support, educate to call if worse; return in one month  10-14= Moderate / Treatment: Support, watchful waiting; Antidepressant or Psycotherapy  15-19= Moderately severe / Treatment: Antidepressant OR Psychotherapy  >= 20 = Major depression, severe / Antidepressant AND Psychotherapy    Review of Systems   Review of Systems  Constitutional: Negative for chills, fever and weight loss.  Respiratory: Negative for cough, hemoptysis, shortness of breath and wheezing.   Cardiovascular: Negative for chest pain, palpitations and leg swelling.  Gastrointestinal: Negative for abdominal pain, diarrhea, nausea and vomiting.  Genitourinary: Negative for dysuria and urgency.  Musculoskeletal: Negative for back pain, myalgias and neck pain.  Skin: Negative for itching and rash.  Neurological: Negative for dizziness, tingling, tremors and headaches.  Endo/Heme/Allergies: Negative for environmental allergies. Does not bruise/bleed easily.  Psychiatric/Behavioral: Negative for depression. The patient is not nervous/anxious.     See HPI for ROS as well.    Objective:   Vitals:   01/15/18 1611  BP: 110/60  Pulse: (!) 120  Resp: 18  Temp: 98.9 F (37.2 C)  TempSrc: Oral  SpO2: 100%  Weight: 133 lb 12.8 oz (60.7 kg)  Height: 5' 5.35" (1.66 m)    Body mass index is 22.02 kg/m.  Physical Exam  BP 110/60   Pulse (!) 120   Temp 98.9 F (37.2 C) (Oral)   Resp 18   Ht 5' 5.35" (1.66 m)   Wt 133 lb 12.8 oz (60.7 kg)   LMP 01/15/2018 Comment: CURRENTLY ON   SpO2 100%   BMI 22.02 kg/m   General Appearance:    Alert, cooperative, no distress, appears stated age  Head:    Normocephalic, without obvious abnormality, atraumatic  Eyes:    PERRL, conjunctiva/corneas clear, EOM's intact, fundi    benign, both eyes  Ears:    Normal TM's and external ear canals, both ears  Nose:   Nares  normal, septum midline, mucosa normal, no drainage    or sinus tenderness  Throat:   Lips, mucosa, and tongue normal; teeth and gums normal  Neck:   Supple, symmetrical, trachea midline, no adenopathy;    thyroid:  no enlargement/tenderness/nodules; no carotid   bruit or JVD  Back:     Symmetric, no curvature, ROM normal, no CVA tenderness  Lungs:     Clear to auscultation bilaterally, respirations unlabored  Chest Wall:    No tenderness or deformity  Abdomen:     Soft, non-tender, bowel sounds active all four quadrants,    no masses, no organomegaly  Extremities:   Extremities normal, atraumatic, no cyanosis or edema  Pulses:   2+ and symmetric all extremities  Skin:   Skin color, texture, turgor  normal, no rashes or lesions  Lymph nodes:   Cervical, supraclavicular, and axillary nodes normal  Neurologic:   CNII-XII intact, normal strength, sensation and reflexes    throughout      Assessment/Plan:   Patient was seen for a health maintenance exam.  Counseled the patient on health maintenance issues. Reviewed her health mainteance schedule and ordered appropriate tests (see orders.) Counseled on regular exercise and weight management. Recommend regular eye exams and dental cleaning.   The following issues were addressed today for health maintenance:   Mally was seen today for annual exam.  Diagnoses and all orders for this visit:  Encounter for health maintenance examination- continue with Gyne for pap smear Will get lipid, cbc and tsh today   Screening, lipid -     Lipid panel  Screening, anemia, deficiency, iron -     CBC  Screening for thyroid disorder -     TSH  Encounter for smoking cessation counseling- discussed smoking cessation  Advised to stop wellbutrin  Discussed options such as chantix but would not prescribed since it also causes dreams  Discussed usage patterns and behavioral changes Time spent counseling: 5 minutes  Other orders -     Cancel:  Flu Vaccine QUAD 36+ mos IM -     Cancel: Tdap vaccine greater than or equal to 7yo IM -     nicotine (NICODERM CQ - DOSED IN MG/24 HR) 7 mg/24hr patch; Place 1 patch (7 mg total) onto the skin daily. For 2 weeks -     nicotine (NICODERM CQ - DOSED IN MG/24 HOURS) 14 mg/24hr patch; Place 1 patch (14 mg total) onto the skin daily. For 2 weeks -     nicotine (NICODERM CQ - DOSED IN MG/24 HOURS) 21 mg/24hr patch; Place 1 patch (21 mg total) onto the skin daily. For 6 weeks. -     nicotine polacrilex (NICORETTE) 4 MG gum; Take 1 each (4 mg total) by mouth as needed for smoking cessation.    No Follow-up on file.    Body mass index is 22.02 kg/m.:  Discussed the patient's BMI with patient. The BMI body mass index is 22.02 kg/m.     No future appointments.  Patient Instructions   For smoking cessation  First thing in the morning, take a 4mg  gum first thing in the morning Begin with step 1 (21mg /day) for six weeks Then step 2 use 14mg  patch for 2 weeks  Then step 3 use 7mg  patch for 2 weeks Continue the gum at 2mg  as needed  Apply to dry skin after cleaning with rubbing alcohol     IF you received an x-ray today, you will receive an invoice from [facility]. Please contact [facility] at [phone] with questions or concerns regarding your invoice.   IF you received labwork today, you will receive an invoice from [facility]. Please contact [facility] at [phone] with questions or concerns regarding your invoice.   Our billing staff will not be able to assist you with questions regarding bills from these companies.  You will be contacted with the lab results as soon as they are available. The fastest way to get your results is to activate your My Chart account. Instructions are located on the last page of this paperwork. If you have not heard from [place] regarding the results in 2 weeks, please contact this office.     Steps to Quit Smoking Smoking tobacco can be  harmful to your health and can affect  almost every organ in your body. Smoking puts you, and those around you, at risk for developing many serious chronic diseases. Quitting smoking is difficult, but it is one of the best things that you can do for your health. It is never too late to quit. What are the benefits of quitting smoking? When you quit smoking, you lower your risk of developing serious diseases and conditions, such as:  Lung cancer or lung disease, such as COPD.  Heart disease.  Stroke.  Heart attack.  Infertility.  Osteoporosis and bone fractures.  Additionally, symptoms such as coughing, wheezing, and shortness of breath may get better when you quit. You may also find that you get sick less often because your body is stronger at fighting off colds and infections. If you are pregnant, quitting smoking can help to reduce your chances of having a baby of low birth weight. How do I get ready to quit? When you decide to quit smoking, create a plan to make sure that you are successful. Before you quit:  Pick a date to quit. Set a date within the next two weeks to give you time to prepare.  Write down the reasons why you are quitting. Keep this list in places where you will see it often, such as on your bathroom mirror or in your car or wallet.  Identify the people, places, things, and activities that make you want to smoke (triggers) and avoid them. Make sure to take these actions: ? Throw away all cigarettes at home, at work, and in your car. ? Throw away smoking accessories, such as Scientist, research (medical). ? Clean your car and make sure to empty the ashtray. ? Clean your home, including curtains and carpets.  Tell your family, friends, and coworkers that you are quitting. Support from your loved ones can make quitting easier.  Talk with your health care provider about your options for quitting smoking.  Find out what treatment options are covered by your health  insurance.  What strategies can I use to quit smoking? Talk with your healthcare provider about different strategies to quit smoking. Some strategies include:  Quitting smoking altogether instead of gradually lessening how much you smoke over a period of time. Research shows that quitting "cold [place]" is more successful than gradually quitting.  Attending in-person counseling to help you build problem-solving skills. You are more likely to have success in quitting if you attend several counseling sessions. Even short sessions of 10 minutes can be effective.  Finding resources and support systems that can help you to quit smoking and remain smoke-free after you quit. These resources are most helpful when you use them often. They can include: ? Online chats with a Social worker. ? Telephone quitlines. ? Careers information officer. ? Support groups or group counseling. ? Text messaging programs. ? Mobile phone applications.  Taking medicines to help you quit smoking. (If you are pregnant or breastfeeding, talk with your health care provider first.) Some medicines contain nicotine and some do not. Both types of medicines help with cravings, but the medicines that include nicotine help to relieve withdrawal symptoms. Your health care provider may recommend: ? Nicotine patches, gum, or lozenges. ? Nicotine inhalers or sprays. ? Non-nicotine medicine that is taken by mouth.  Talk with your health care provider about combining strategies, such as taking medicines while you are also receiving in-person counseling. Using these two strategies together makes you more likely to succeed in quitting than if you used either strategy on  its own. If you are pregnant or breastfeeding, talk with your health care provider about finding counseling or other support strategies to quit smoking. Do not take medicine to help you quit smoking unless told to do so by your health care provider. What things can I do to make  it easier to quit? Quitting smoking might feel overwhelming at first, but there is a lot that you can do to make it easier. Take these important actions:  Reach out to your family and friends and ask that they support and encourage you during this time. Call telephone quitlines, reach out to support groups, or work with a counselor for support.  Ask people who smoke to avoid smoking around you.  Avoid places that trigger you to smoke, such as bars, parties, or smoke-break areas at work.  Spend time around people who do not smoke.  Lessen stress in your life, because stress can be a smoking trigger for some people. To lessen stress, try: ? Exercising regularly. ? Deep-breathing exercises. ? Yoga. ? Meditating. ? Performing a body scan. This involves closing your eyes, scanning your body from head to toe, and noticing which parts of your body are particularly tense. Purposefully relax the muscles in those areas.  Download or purchase mobile phone or tablet apps (applications) that can help you stick to your quit plan by providing reminders, tips, and encouragement. There are many free apps, such as QuitGuide from the [employer] Office manager for Disease Control and Prevention). You can find other support for quitting smoking (smoking cessation) through smokefree.gov and other websites.  How will I feel when I quit smoking? Within the first 24 hours of quitting smoking, you may start to feel some withdrawal symptoms. These symptoms are usually most noticeable 2-3 days after quitting, but they usually do not last beyond 2-3 weeks. Changes or symptoms that you might experience include:  Mood swings.  Restlessness, anxiety, or irritation.  Difficulty concentrating.  Dizziness.  Strong cravings for sugary foods in addition to nicotine.  Mild weight gain.  Constipation.  Nausea.  Coughing or a sore throat.  Changes in how your medicines work in your body.  A depressed mood.  Difficulty  sleeping (insomnia).  After the first 2-3 weeks of quitting, you may start to notice more positive results, such as:  Improved sense of smell and taste.  Decreased coughing and sore throat.  Slower heart rate.  Lower blood pressure.  Clearer skin.  The ability to breathe more easily.  Fewer sick days.  Quitting smoking is very challenging for most people. Do not get discouraged if you are not successful the first time. Some people need to make many attempts to quit before they achieve long-term success. Do your best to stick to your quit plan, and talk with your health care provider if you have any questions or concerns. This information is not intended to replace advice given to you by your health care provider. Make sure you discuss any questions you have with your health care provider. Document Released: 11/05/2001 Document Revised: 07/09/2016 Document Reviewed: 03/28/2015 Elsevier Interactive Patient Education  [employer].

## 2018-01-15 ENCOUNTER — Encounter: Payer: Self-pay | Admitting: Family Medicine

## 2018-01-15 ENCOUNTER — Ambulatory Visit: Payer: 59 | Admitting: Family Medicine

## 2018-01-15 ENCOUNTER — Other Ambulatory Visit: Payer: Self-pay

## 2018-01-15 VITALS — BP 110/60 | HR 120 | Temp 98.9°F | Resp 18 | Ht 65.35 in | Wt 133.8 lb

## 2018-01-15 DIAGNOSIS — Z716 Tobacco abuse counseling: Secondary | ICD-10-CM | POA: Diagnosis not present

## 2018-01-15 DIAGNOSIS — Z130 Encounter for screening for diseases of the blood and blood-forming organs and certain disorders involving the immune mechanism: Secondary | ICD-10-CM | POA: Diagnosis not present

## 2018-01-15 DIAGNOSIS — Z1329 Encounter for screening for other suspected endocrine disorder: Secondary | ICD-10-CM

## 2018-01-15 DIAGNOSIS — F17210 Nicotine dependence, cigarettes, uncomplicated: Secondary | ICD-10-CM | POA: Diagnosis not present

## 2018-01-15 DIAGNOSIS — Z13220 Encounter for screening for lipoid disorders: Secondary | ICD-10-CM | POA: Diagnosis not present

## 2018-01-15 DIAGNOSIS — Z0000 Encounter for general adult medical examination without abnormal findings: Secondary | ICD-10-CM

## 2018-01-15 MED ORDER — NICOTINE 21 MG/24HR TD PT24: 21.0000 mg | MEDICATED_PATCH | Freq: Every day | TRANSDERMAL | 1 refills | Status: DC

## 2018-01-15 MED ORDER — NICOTINE 14 MG/24HR TD PT24: 14.0000 mg | MEDICATED_PATCH | Freq: Every day | TRANSDERMAL | 0 refills | Status: DC

## 2018-01-15 MED ORDER — NICOTINE 7 MG/24HR TD PT24: 7.0000 mg | MEDICATED_PATCH | Freq: Every day | TRANSDERMAL | 0 refills | Status: DC

## 2018-01-15 MED ORDER — NICOTINE POLACRILEX 4 MG MT GUM: 4.0000 mg | CHEWING_GUM | OROMUCOSAL | 0 refills | Status: DC | PRN

## 2018-01-15 NOTE — Patient Instructions (Addendum)
For smoking cessation  First thing in the morning, take a 4mg  gum first thing in the morning Begin with step 1 (21mg /day) for six weeks Then step 2 use 14mg  patch for 2 weeks  Then step 3 use 7mg  patch for 2 weeks Continue the gum at 2mg  as needed  Apply to dry skin after cleaning with rubbing alcohol     IF you received an x-ray today, you will receive an invoice from [facility]. Please contact [facility] at [phone] with questions or concerns regarding your invoice.   IF you received labwork today, you will receive an invoice from [place]. Please contact [facility] at [phone] with questions or concerns regarding your invoice.   Our billing staff will not be able to assist you with questions regarding bills from these companies.  You will be contacted with the lab results as soon as they are available. The fastest way to get your results is to activate your My Chart account. Instructions are located on the last page of this paperwork. If you have not heard from [place] regarding the results in 2 weeks, please contact this office.     Steps to Quit Smoking Smoking tobacco can be harmful to your health and can affect almost every organ in your body. Smoking puts you, and those around you, at risk for developing many serious chronic diseases. Quitting smoking is difficult, but it is one of the best things that you can do for your health. It is never too late to quit. What are the benefits of quitting smoking? When you quit smoking, you lower your risk of developing serious diseases and conditions, such as:  Lung cancer or lung disease, such as COPD.  Heart disease.  Stroke.  Heart attack.  Infertility.  Osteoporosis and bone fractures.  Additionally, symptoms such as coughing, wheezing, and shortness of breath may get better when you quit. You may also find that you get sick less often because your body is stronger at fighting off colds and infections. If  you are pregnant, quitting smoking can help to reduce your chances of having a baby of low birth weight. How do I get ready to quit? When you decide to quit smoking, create a plan to make sure that you are successful. Before you quit:  Pick a date to quit. Set a date within the next two weeks to give you time to prepare.  Write down the reasons why you are quitting. Keep this list in places where you will see it often, such as on your bathroom mirror or in your car or wallet.  Identify the people, places, things, and activities that make you want to smoke (triggers) and avoid them. Make sure to take these actions: ? Throw away all cigarettes at home, at work, and in your car. ? Throw away smoking accessories, such as Scientist, research (medical). ? Clean your car and make sure to empty the ashtray. ? Clean your home, including curtains and carpets.  Tell your family, friends, and coworkers that you are quitting. Support from your loved ones can make quitting easier.  Talk with your health care provider about your options for quitting smoking.  Find out what treatment options are covered by your health insurance.  What strategies can I use to quit smoking? Talk with your healthcare provider about different strategies to quit smoking. Some strategies include:  Quitting smoking altogether instead of gradually lessening how much you smoke over a period of time. Research shows that quitting "cold  [place]" is more successful than gradually quitting.  Attending in-person counseling to help you build problem-solving skills. You are more likely to have success in quitting if you attend several counseling sessions. Even short sessions of 10 minutes can be effective.  Finding resources and support systems that can help you to quit smoking and remain smoke-free after you quit. These resources are most helpful when you use them often. They can include: ? Online chats with a Social worker. ? Telephone  quitlines. ? Careers information officer. ? Support groups or group counseling. ? Text messaging programs. ? Mobile phone applications.  Taking medicines to help you quit smoking. (If you are pregnant or breastfeeding, talk with your health care provider first.) Some medicines contain nicotine and some do not. Both types of medicines help with cravings, but the medicines that include nicotine help to relieve withdrawal symptoms. Your health care provider may recommend: ? Nicotine patches, gum, or lozenges. ? Nicotine inhalers or sprays. ? Non-nicotine medicine that is taken by mouth.  Talk with your health care provider about combining strategies, such as taking medicines while you are also receiving in-person counseling. Using these two strategies together makes you more likely to succeed in quitting than if you used either strategy on its own. If you are pregnant or breastfeeding, talk with your health care provider about finding counseling or other support strategies to quit smoking. Do not take medicine to help you quit smoking unless told to do so by your health care provider. What things can I do to make it easier to quit? Quitting smoking might feel overwhelming at first, but there is a lot that you can do to make it easier. Take these important actions:  Reach out to your family and friends and ask that they support and encourage you during this time. Call telephone quitlines, reach out to support groups, or work with a counselor for support.  Ask people who smoke to avoid smoking around you.  Avoid places that trigger you to smoke, such as bars, parties, or smoke-break areas at work.  Spend time around people who do not smoke.  Lessen stress in your life, because stress can be a smoking trigger for some people. To lessen stress, try: ? Exercising regularly. ? Deep-breathing exercises. ? Yoga. ? Meditating. ? Performing a body scan. This involves closing your eyes, scanning your  body from head to toe, and noticing which parts of your body are particularly tense. Purposefully relax the muscles in those areas.  Download or purchase mobile phone or tablet apps (applications) that can help you stick to your quit plan by providing reminders, tips, and encouragement. There are many free apps, such as QuitGuide from the [employer] Office manager for Disease Control and Prevention). You can find other support for quitting smoking (smoking cessation) through smokefree.gov and other websites.  How will I feel when I quit smoking? Within the first 24 hours of quitting smoking, you may start to feel some withdrawal symptoms. These symptoms are usually most noticeable 2-3 days after quitting, but they usually do not last beyond 2-3 weeks. Changes or symptoms that you might experience include:  Mood swings.  Restlessness, anxiety, or irritation.  Difficulty concentrating.  Dizziness.  Strong cravings for sugary foods in addition to nicotine.  Mild weight gain.  Constipation.  Nausea.  Coughing or a sore throat.  Changes in how your medicines work in your body.  A depressed mood.  Difficulty sleeping (insomnia).  After the first 2-3 weeks of quitting, you  may start to notice more positive results, such as:  Improved sense of smell and taste.  Decreased coughing and sore throat.  Slower heart rate.  Lower blood pressure.  Clearer skin.  The ability to breathe more easily.  Fewer sick days.  Quitting smoking is very challenging for most people. Do not get discouraged if you are not successful the first time. Some people need to make many attempts to quit before they achieve long-term success. Do your best to stick to your quit plan, and talk with your health care provider if you have any questions or concerns. This information is not intended to replace advice given to you by your health care provider. Make sure you discuss any questions you have with your health care  provider. Document Released: 11/05/2001 Document Revised: 07/09/2016 Document Reviewed: 03/28/2015 Elsevier Interactive Patient Education  [employer].

## 2018-01-16 LAB — CBC
HEMOGLOBIN: 14.2 g/dL (ref 11.1–15.9)
Hematocrit: 39.8 % (ref 34.0–46.6)
MCH: 34.3 pg — ABNORMAL HIGH (ref 26.6–33.0)
MCHC: 35.7 g/dL (ref 31.5–35.7)
MCV: 96 fL (ref 79–97)
Platelets: 250 10*3/uL (ref 150–379)
RBC: 4.14 x10E6/uL (ref 3.77–5.28)
RDW: 12.5 % (ref 12.3–15.4)
WBC: 6.5 10*3/uL (ref 3.4–10.8)

## 2018-01-16 LAB — TSH: TSH: 0.387 u[IU]/mL — AB (ref 0.450–4.500)

## 2018-01-16 LAB — LIPID PANEL
CHOL/HDL RATIO: 3.7 ratio (ref 0.0–4.4)
Cholesterol, Total: 189 mg/dL (ref 100–199)
HDL: 51 mg/dL (ref >39)
LDL Calculated: 125 mg/dL — ABNORMAL HIGH (ref 0–99)
Triglycerides: 64 mg/dL (ref 0–149)
VLDL Cholesterol Cal: 13 mg/dL (ref 5–40)

## 2018-02-05 ENCOUNTER — Other Ambulatory Visit: Payer: Self-pay | Admitting: Family Medicine

## 2018-02-05 DIAGNOSIS — R7989 Other specified abnormal findings of blood chemistry: Secondary | ICD-10-CM

## 2018-02-16 ENCOUNTER — Ambulatory Visit (INDEPENDENT_AMBULATORY_CARE_PROVIDER_SITE_OTHER): Payer: 59 | Admitting: Physician Assistant

## 2018-02-16 ENCOUNTER — Encounter: Payer: Self-pay | Admitting: Physician Assistant

## 2018-02-16 VITALS — BP 114/70 | HR 87 | Temp 99.1°F | Resp 16 | Ht 66.0 in | Wt 144.0 lb

## 2018-02-16 DIAGNOSIS — R7989 Other specified abnormal findings of blood chemistry: Secondary | ICD-10-CM | POA: Diagnosis not present

## 2018-02-16 DIAGNOSIS — Z111 Encounter for screening for respiratory tuberculosis: Secondary | ICD-10-CM

## 2018-02-16 NOTE — Patient Instructions (Addendum)
TB SKIN TEST TO BE READ IN 48-72 HOURS AT [address]    IF you received an x-ray today, you will receive an invoice from [facility]. Please contact [facility] at [phone] with questions or concerns regarding your invoice.   IF you received labwork today, you will receive an invoice from [facility]. Please contact [facility] at [phone] with questions or concerns regarding your invoice.   Our billing staff will not be able to assist you with questions regarding bills from these companies.  You will be contacted with the lab results as soon as they are available. The fastest way to get your results is to activate your My Chart account. Instructions are located on the last page of this paperwork. If you have not heard from [place] regarding the results in 2 weeks, please contact this office.

## 2018-02-16 NOTE — Progress Notes (Signed)
  Tuberculosis Risk Questionnaire  1. No Were you born outside the [place] in one of the following parts of the world: [place], [place], [place], [place] or [place]?    2. No Have you traveled outside the [place] and lived for more than one month in one of the following parts of the world: [place], [place], [place], [place] or [place]?    3. No Do you have a compromised immune system such as from any of the following conditions:HIV/AIDS, organ or bone marrow transplantation, diabetes, immunosuppressive medicines (e.g. Prednisone, Remicaide), leukemia, lymphoma, cancer of the head or neck, gastrectomy or jejunal bypass, end-stage renal disease (on dialysis), or silicosis?     4. No Have you ever or do you plan on working in: a residential care center, a health care facility, a jail or prison or homeless shelter?    5. No Have you ever: injected illegal drugs, used crack cocaine, lived in a homeless shelter  or been in jail or prison?     6. No Have you ever been exposed to anyone with infectious tuberculosis?  7. No Have you ever had a BCG vaccine? (BCG is a vaccine for tuberculosis  (TB) used in OTHER countries, NOT in the [place]).  8. No Have you ever been advised by a health care provider NOT to have a TB skin test?  9. No Have you ever had a POSITIVE TB skin test?  IF SO, whe  IF SO, were you treated with INH  IF SO, where? Tuberculosis Symptom Questionnaire  Do you currently have any of the following symptoms?  1. No Unexplained cough lasting more than 3 weeks?   2. No Unexplained fever lasting more than 3 weeks.   3. No Night Sweats (sweating that leaves the bedclothes and sheets wet)     4. No Shortness of Breath   5. No Chest Pain   6. No Unintentional weight loss    7. No Unexplained fatigue (very tired for no reason)

## 2018-02-16 NOTE — Progress Notes (Signed)
    02/20/2018 8:24 AM   DOB: 1981-05-29 / MRN: [id number]  SUBJECTIVE:  Katherine Holmes is a 37 y.o. female presenting for thyroid recheck.  She was seen by Dr. Nolon Rod on 01/15/2018 and was found to have mildly decreased TSH.  She was advised to have this rechecked.  She would like to have a TB skin test placed today.  She has No Known Allergies.   She  has a past medical history of Anemia, Endometriosis, and Medical history non-contributory.    She  reports that she has been smoking cigarettes.  She has been smoking about 0.50 packs per day. She has never used smokeless tobacco. She reports that she drinks alcohol. She reports that she does not use drugs. She  has no sexual activity history on file. The patient  has a past surgical history that includes No past surgeries; laparoscopy (N/A, 04/02/2013); Myomectomy (N/A, 04/02/2013); and Laparoscopic lysis of adhesions (N/A, 04/02/2013).  Her family history includes Cancer in her maternal grandmother; Diabetes in her mother; Hypertension in her father, mother, and sister.  Review of Systems  Constitutional: Negative for chills, diaphoresis and fever.  Eyes: Negative.   Respiratory: Negative for shortness of breath.   Cardiovascular: Negative for chest pain, orthopnea and leg swelling.  Gastrointestinal: Negative for abdominal pain, blood in stool, constipation, diarrhea, heartburn, melena, nausea and vomiting.  Genitourinary: Negative for flank pain.  Skin: Negative for rash.  Neurological: Negative for dizziness, sensory change, speech change, focal weakness and headaches.    The problem list and medications were reviewed and updated by myself where necessary and exist elsewhere in the encounter.   OBJECTIVE:  BP 114/70   Pulse 87   Temp 99.1 F (37.3 C) (Oral)   Resp 16   Ht 5\' 6"  (1.676 m)   Wt 144 lb (65.3 kg)   SpO2 99%   BMI 23.24 kg/m   Physical Exam  Constitutional: She is active.  Non-toxic appearance.    Cardiovascular: Normal rate.  Pulmonary/Chest: Effort normal. No tachypnea.  Neurological: She is alert.  Skin: Skin is warm and dry. She is not diaphoretic. No pallor.    Results for orders placed or performed in visit on 02/16/18 (from the past 72 hour(s))  PPD     Status: Normal   Collection Time: 02/19/18  4:05 PM  Result Value Ref Range   TB Skin Test Negative    Induration 0 mm    No results found.  ASSESSMENT AND PLAN:  Katherine Holmes was seen today for tb skin test and tsh.  Diagnoses and all orders for this visit:  Low TSH level -     TSH -     T4, Free  Screening-pulmonary TB -     PPD    The patient is advised to call or return to clinic if she does not see an improvement in symptoms, or to seek the care of the closest emergency department if she worsens with the above plan.   Philis Fendt, MHS, PA-C [facility] 02/20/2018 8:24 AM

## 2018-02-17 LAB — TSH: TSH: 0.438 u[IU]/mL — AB (ref 0.450–4.500)

## 2018-02-17 LAB — T4, FREE: FREE T4: 1.09 ng/dL (ref 0.82–1.77)

## 2018-02-19 ENCOUNTER — Ambulatory Visit: Payer: 59

## 2018-02-19 DIAGNOSIS — Z111 Encounter for screening for respiratory tuberculosis: Secondary | ICD-10-CM

## 2018-02-19 LAB — TB SKIN TEST
Induration: 0 mm
TB Skin Test: NEGATIVE

## 2018-02-19 NOTE — Progress Notes (Signed)
PPD Reading Note PPD read and results entered in [place]. Result: 0 mm induration. Interpretation: negative If test not read within 48-72 hours of initial placement, patient advised to repeat in other arm 1-3 weeks after this test.

## 2018-03-03 ENCOUNTER — Ambulatory Visit: Payer: Self-pay

## 2018-03-03 NOTE — Telephone Encounter (Signed)
Pt calling with new onset of heart "fluttering". Sx began on Saturday and pt states that it lasts for just a second then goes away. Pt states it comes and goes and she does not experience them with exertion. When she tapped out the palpitation or "flutter" she tapped twice then back to a regular beat or rhythm. Pt's HR is 84. She stated she had 4 episodes of fluttering today. Pt recently stopped smoking and has gained 20 pounds. She admits to drinking a large amount of soda with caffeine. Pt stated that she had a murmur as a baby and by the age of 88 the murmur went away. Pt is currently on taking deposhots and just stopped bleeding after having bleeding for 9 months. Care advice given. Appt accepted for tomorrow at 4:20 pm with her PCP.  Reason for Disposition . [1] Palpitations AND [2] no improvement after following Care Advice  Answer Assessment - Initial Assessment Questions 1. DESCRIPTION: "Please describe your heart rate or heart beat that you are having" (e.g., fast/slow, regular/irregular, skipped or extra beats, "palpitations")     Feels like a "flutter" 2. ONSET: "When did it start?" (Minutes, hours or days)      Saturday 3. DURATION: "How long does it last" (e.g., seconds, minutes, hours)     1 second  4. PATTERN "Does it come and go, or has it been constant since it started?"  "Does it get worse with exertion?"   "Are you feeling it now?"     Comes and goes- does not get worse with exertion- not feeling it now. 5. TAP: "Using your hand, can you tap out what you are feeling on a chair or table in front of you, so that I can hear?" (Note: not all patients can do this)      She tapped twice with the palpitation 6. HEART RATE: "Can you tell me your heart rate?" "How many beats in 15 seconds?"  (Note: not all patients can do this)       21 (84 bpm) 7. RECURRENT SYMPTOM: "Have you ever had this before?" If so, ask: "When was the last time?" and "What happened that time?"      no 8. CAUSE:  "What do you think is causing the palpitations?"     Stopped smoking and eating too much food- gained 20 lbs past 3-4 weeks  9. CARDIAC HISTORY: "Do you have any history of heart disease?" (e.g., heart attack, angina, bypass surgery, angioplasty, arrhythmia)      Had a murmur that was followed by Doctor and went away when pt turned 1  10. OTHER SYMPTOMS: "Do you have any other symptoms?" (e.g., dizziness, chest pain, sweating, difficulty breathing)       no 11. PREGNANCY: "Is there any chance you are pregnant?" "When was your last menstrual period?"       No- has endometriosis and is on deposhot and was having 9 month bleeding stopped 5 days ago  Protocols used: [place]

## 2018-03-04 ENCOUNTER — Ambulatory Visit: Payer: 59 | Admitting: Family Medicine

## 2018-03-06 ENCOUNTER — Ambulatory Visit: Payer: 59 | Admitting: Physician Assistant

## 2018-03-06 DIAGNOSIS — D259 Leiomyoma of uterus, unspecified: Secondary | ICD-10-CM | POA: Diagnosis not present

## 2018-03-06 DIAGNOSIS — Z01419 Encounter for gynecological examination (general) (routine) without abnormal findings: Secondary | ICD-10-CM | POA: Diagnosis not present

## 2018-03-13 MED FILL — SPIRONOLACTONE 50 MG TAB: 50 | 90 days supply | Qty: 90 | Fill #0

## 2018-07-06 IMAGING — US US TRANSVAGINAL NON-OB
1 series · 13 of 25 positions shown · non-contrast
Comparison: None.

CLINICAL DATA: Initial evaluation for acute pelvic pain. History of
fibroid removal and endometriosis.

EXAM:
TRANSABDOMINAL AND TRANSVAGINAL ULTRASOUND OF PELVIS
DOPPLER ULTRASOUND OF OVARIES
TECHNIQUE: Both transabdominal and transvaginal ultrasound examinations of the
pelvis were performed. Transabdominal technique was performed for
global imaging of the pelvis including uterus, ovaries, adnexal
regions, and pelvic cul-de-sac.
It was necessary to proceed with endovaginal exam following the
transabdominal exam to visualize the uterus and ovaries. Color and
duplex Doppler ultrasound was utilized to evaluate blood flow to the
ovaries.

[Series 1: us transvaginal non-ob · 0.21mm/px · 13 of 65 slices shown]
[im 1/65]
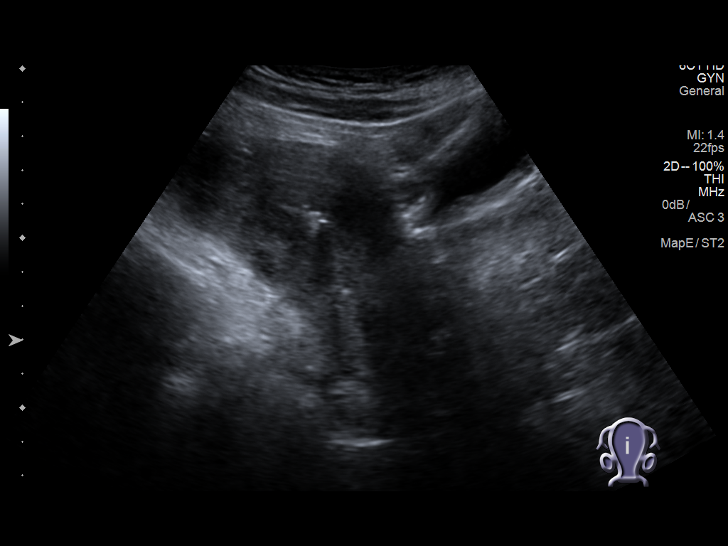
[im 6/65]
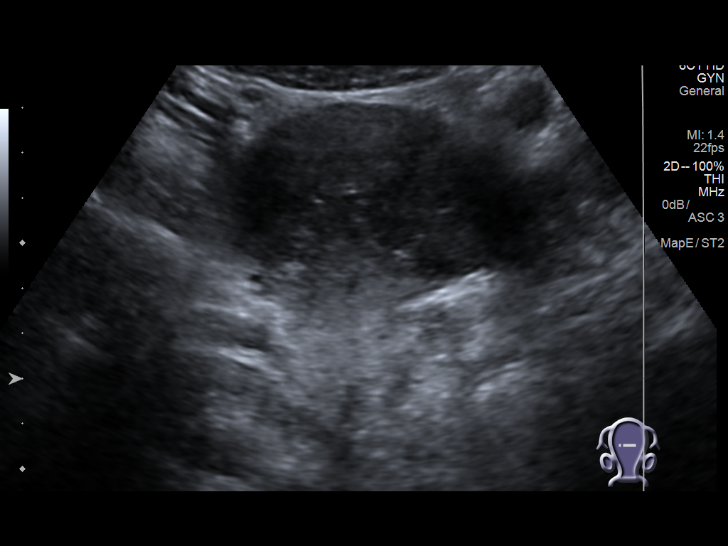
[im 11/65]
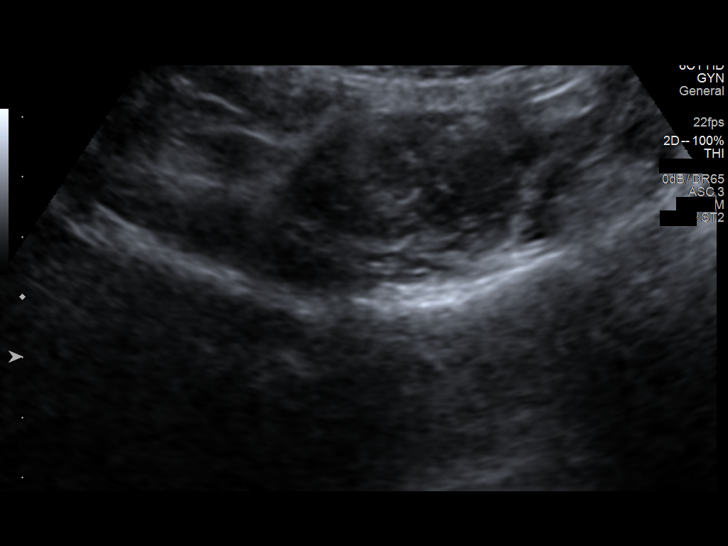
[im 17/65]
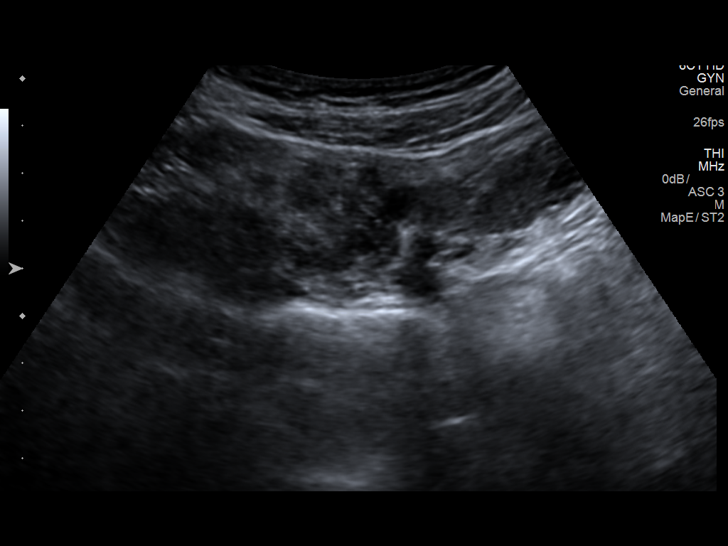
[im 22/65]
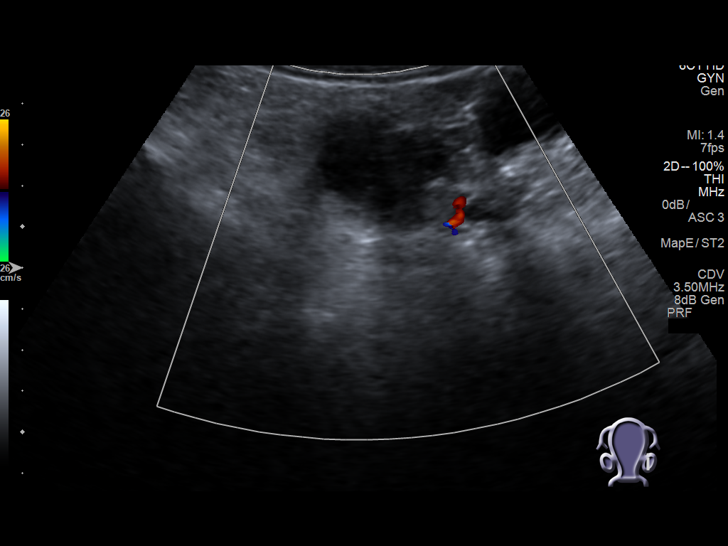
[im 27/65]
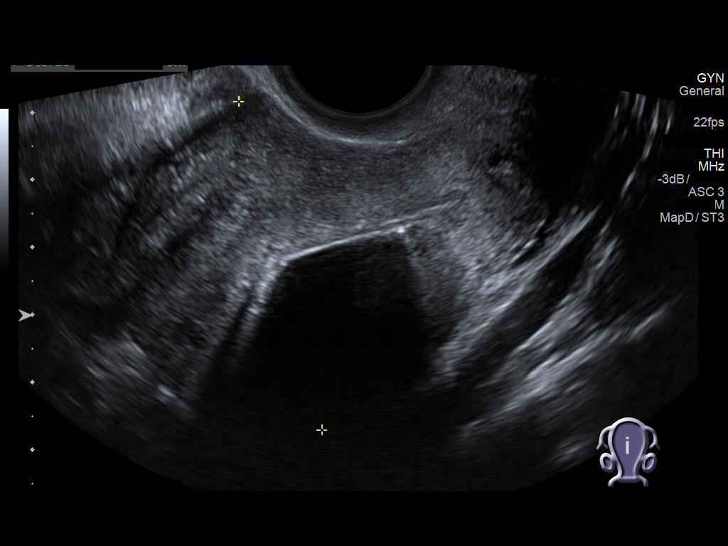
[im 33/65]
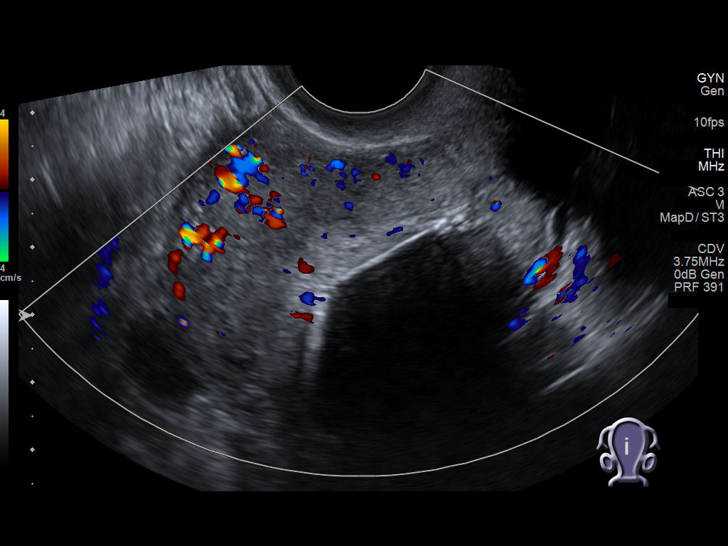
[im 38/65]
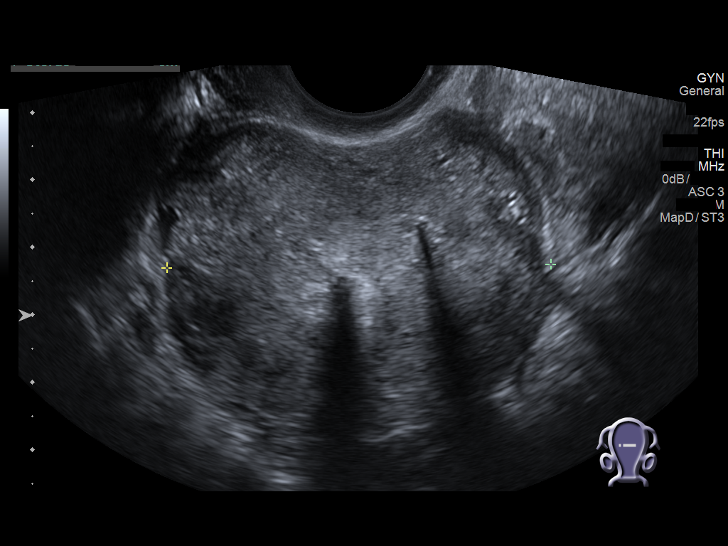
[im 43/65]
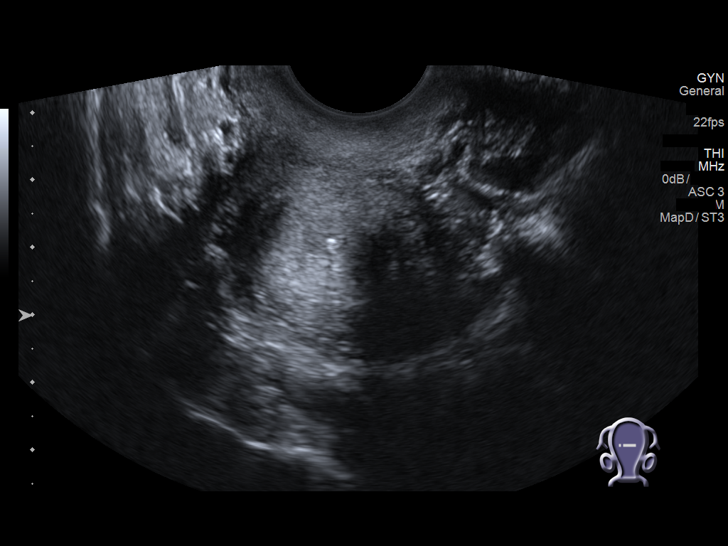
[im 49/65]
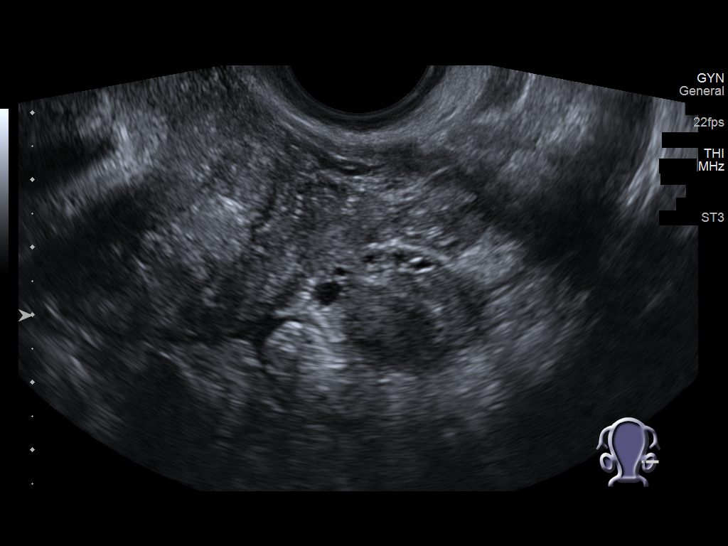
[im 54/65]
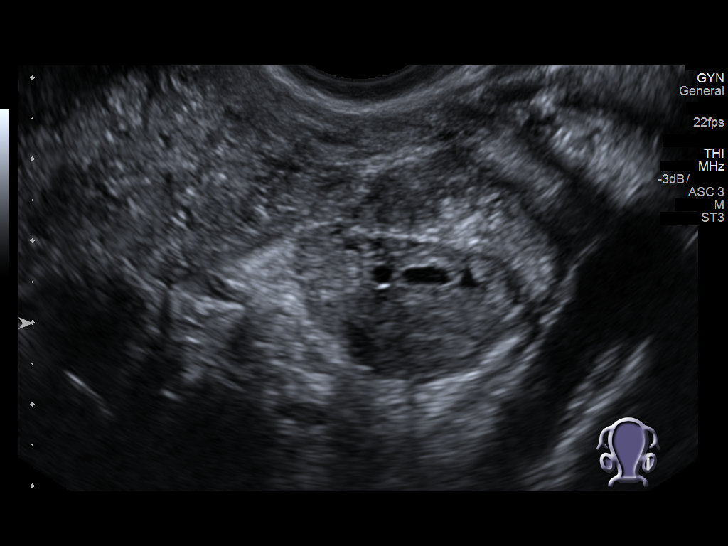
[im 59/65]
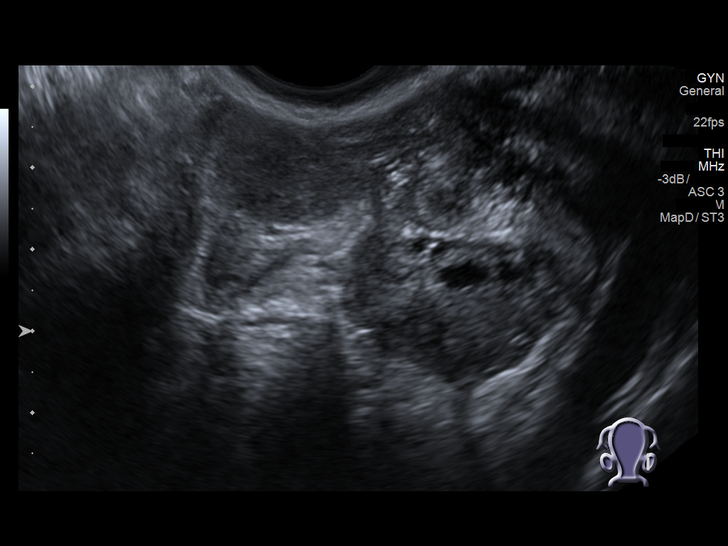
[im 65/65]
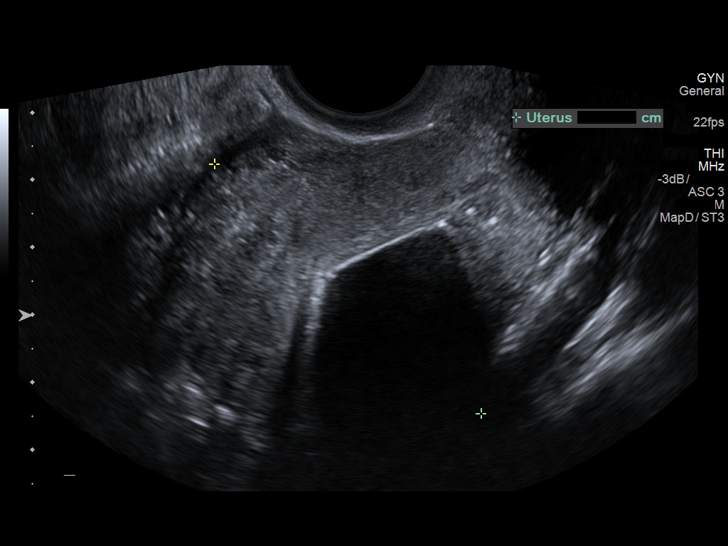

[13 of 25 positions shown; findings below may reference images not displayed]

FINDINGS: Uterus

Measurements: 5.4 x 5.7 x 5.0 cm. 1.6 x 1.4 x 1.4 cm fibroid present
at the uterine fundus.

Endometrium

Not well visualize with IUD in place in the endometrial canal. IUD
appears appropriately positioned.

Right ovary

Measurements: 4.0 x 4.8 x 3.0 cm. Normal appearance/no adnexal mass.

Left ovary

Measurements: 2.5 x 2.9 x 1.8 cm. Normal appearance/no adnexal mass.

Pulsed Doppler evaluation of the left ovary demonstrates normal
low-resistance arterial and venous waveforms. Right ovary was only
visualized trans abdominally, in did demonstrate color flow,
although pulsed Doppler evaluation was not performed.

Other findings

No abnormal free fluid.
IMPRESSION: 1. No acute abnormality identified within the pelvis. Please note
that evaluation of the right ovary was somewhat limited as it was
only visualized transabdominally. Right ovary demonstrated internal
color flow, although pulsed Doppler evaluation was not performed. If
there is high clinical concern for possible right-sided ovarian
torsion, a repeat study is recommended.
2. Normal left ovary, with no evidence for torsion.
3. 1.6 x 1.4 x 1.4 cm uterine fibroid.
4. IUD in appropriate position within the endometrial canal.

## 2018-09-01 DIAGNOSIS — J069 Acute upper respiratory infection, unspecified: Secondary | ICD-10-CM | POA: Diagnosis not present

## 2018-10-12 DIAGNOSIS — L700 Acne vulgaris: Secondary | ICD-10-CM | POA: Diagnosis not present

## 2018-10-12 DIAGNOSIS — Z23 Encounter for immunization: Secondary | ICD-10-CM | POA: Diagnosis not present

## 2018-10-12 MED FILL — SPIRONOLACTONE 50 MG TABLET: 50 | 90 days supply | Qty: 90 | Fill #0

## 2019-01-01 MED FILL — SPIRONOLACTONE 50 MG TABLET: 50 | 90 days supply | Qty: 90 | Fill #1

## 2019-07-20 ENCOUNTER — Ambulatory Visit
Admission: EM | Admit: 2019-07-20 | Discharge: 2019-07-20 | Disposition: A | Discharge status: Home / Self Care | Payer: 59 | Attending: Emergency Medicine | Admitting: Emergency Medicine

## 2019-07-20 ENCOUNTER — Other Ambulatory Visit: Payer: Self-pay

## 2019-07-20 ENCOUNTER — Encounter: Payer: Self-pay | Admitting: Emergency Medicine

## 2019-07-20 DIAGNOSIS — R42 Dizziness and giddiness: Secondary | ICD-10-CM | POA: Diagnosis not present

## 2019-07-20 LAB — POCT URINALYSIS DIP (MANUAL ENTRY)
Bilirubin, UA: NEGATIVE
Glucose, UA: NEGATIVE mg/dL
Leukocytes, UA: NEGATIVE
Nitrite, UA: NEGATIVE
Protein Ur, POC: NEGATIVE mg/dL
Spec Grav, UA: 1.025 (ref 1.010–1.025)
Urobilinogen, UA: 0.2 E.U./dL
pH, UA: 5.5 (ref 5.0–8.0)

## 2019-07-20 LAB — POCT FASTING CBG KUC MANUAL ENTRY: POCT Glucose (KUC): 102 mg/dL — AB (ref 70–99)

## 2019-07-20 MED ORDER — MECLIZINE HCL 12.5 MG PO TABS: 12.5000 mg | ORAL_TABLET | Freq: Three times a day (TID) | ORAL | 0 refills | Status: DC | PRN

## 2019-07-20 NOTE — ED Triage Notes (Addendum)
Pt presents to [facility] for assessment of dizziness, sudden onset 1 week ago after an episode that woke her up out of her sleep of diarrhea and emesis.  Patient denies any more diarrhea, emesis or abdominal pain since, but c/o intermittent dizziness, persistent since.  Patient states dizziness is worth when laying down at night.  C/o of "feeling drunk" when laying down.  C/o nausea, increase in her core temperature, and headaches with a "woozy" feeling.  Mom has a hx of DM, father a hx of HBP.  Pt has a hx of stage IV endometriosis and anemia.  Patient states she has been off of hormones for 1 year, and has returned to having periods.  Took a home pregnancy test, which was negative.  Pt endorses his urine being darker than normal recently.  States she also is starting to not enjoy smoking cigarettes.

## 2019-07-20 NOTE — Discharge Instructions (Signed)
Partner to maintain good water intake. Keep symptom log: When you experience symptoms, how long they last, what you are doing with a starter, what you did to resolve them. Bring this to PCP appointment on 9/1.

## 2019-07-20 NOTE — ED Notes (Signed)
Patient able to ambulate independently  

## 2019-07-20 NOTE — ED Provider Notes (Addendum)
[facility]    CSN: [id number] Arrival date & time: 07/20/19  1408      History   Chief Complaint Chief Complaint  Patient presents with  . Dizziness    HPI Katherine Holmes is a 38 y.o. female with history of anemia and endometriosis presenting for multiple concerns.  Nursing note reviewed by me at time of appointment.  In summary, patient has had near daily, nonexertional subjective dizziness for the last week.  This is been accompanied by "feeling drunk "when laying down.  Patient has had some nausea, denies vomiting.  Patient is unable to discern specific exacerbating or's, alleviators.  Patient feels that her urine has been darker than normal, though denies urinary frequency, urgency, burning with urination, vaginal pelvic pain, vaginal discharge.  Patient not taking hormone contraception.  Denies history of blood clot.  No previous CVA, MI.  Patient denies chest pain or shortness of breath.  Patient concerned given her mother's history of diabetes, father's history of hypertension.  Patient currently sexually active, LMP 8/19: Took a pregnancy test yesterday: Negative for patient.  Denies history of thyroid disease.  Past Medical History:  Diagnosis Date  . Anemia   . Endometriosis   . Medical history non-contributory     There are no active problems to display for this patient.   Past Surgical History:  Procedure Laterality Date  . LAPAROSCOPIC LYSIS OF ADHESIONS N/A 04/02/2013   Procedure: LAPAROSCOPIC LYSIS OF ADHESIONS;  Surgeon: Governor Specking, MD;  Location: [facility];  Service: Gynecology;  Laterality: N/A;  . LAPAROSCOPY N/A 04/02/2013   Procedure: Operative Laparoscopy with Gel Port-Assisted Myomectomy and Excision of Endometriosis;  Surgeon: Governor Specking, MD;  Location: [facility];  Service: Gynecology;  Laterality: N/A;  . MYOMECTOMY N/A 04/02/2013   Procedure: ASSISTED MYOMECTOMY;  Surgeon: Governor Specking, MD;  Location: [facility];  Service: Gynecology;   Laterality: N/A;  . NO PAST SURGERIES      OB History   No obstetric history on file.      Home Medications    Prior to Admission medications   Medication Sig Start Date End Date Taking? Authorizing Provider  acyclovir (ZOVIRAX) 400 MG tablet Take 400 mg by mouth 2 (two) times daily.    [provider]  levonorgestrel (MIRENA) 20 MCG/24HR IUD 1 each by Intrauterine route once.    [provider]  meclizine (ANTIVERT) 12.5 MG tablet Take 1 tablet (12.5 mg total) by mouth 3 (three) times daily as needed for dizziness. 07/20/19   Hall-Potvin, [place], PA-C  spironolactone (ALDACTONE) 50 MG tablet Take 50 mg by mouth daily.    [provider]  estradiol (ESTRACE) 1 MG tablet Take 1 mg by mouth daily as needed (menstration).  07/20/19  [provider]  medroxyPROGESTERone (DEPO-PROVERA) 150 MG/ML injection Inject 150 mg into the muscle every 3 (three) months.  07/20/19  [provider]    Family History Family History  Problem Relation Age of Onset  . Diabetes Mother   . Hypertension Mother   . Hypertension Father   . Hypertension Sister   . Cancer Maternal Grandmother     Social History Social History   Tobacco Use  . Smoking status: Current Every Day Smoker    Packs/day: 0.25    Types: Cigarettes  . Smokeless tobacco: Never Used  Substance Use Topics  . Alcohol use: Yes    Comment: occasionally  . Drug use: No     Allergies   Patient has  no known allergies.   Review of Systems Review of Systems  Constitutional: Positive for appetite change. Negative for fatigue and fever.  HENT: Negative for congestion, dental problem, hearing loss, sore throat, tinnitus, trouble swallowing and voice change.   Eyes: Negative for redness.  Respiratory: Negative for cough and shortness of breath.   Cardiovascular: Negative for chest pain, palpitations and leg swelling.  Gastrointestinal: Positive for nausea. Negative for abdominal  distention, diarrhea and vomiting.  Genitourinary: Negative for frequency, hematuria, pelvic pain, urgency, vaginal bleeding, vaginal discharge and vaginal pain.     Physical Exam Triage Vital Signs ED Triage Vitals  Enc Vitals Group     BP 07/20/19 1526 123/82     Pulse Rate 07/20/19 1526 98     Resp 07/20/19 1526 16     Temp 07/20/19 1526 99 F (37.2 C)     Temp Source 07/20/19 1526 Oral     SpO2 07/20/19 1526 100 %     Weight --      Height --      Head Circumference --      Peak Flow --      Pain Score 07/20/19 1557 0     Pain Loc --      Pain Edu? --      Excl. in [place]? --    No data found.  Updated Vital Signs BP 123/82 (BP Location: Left Arm)   Pulse 98   Temp 99 F (37.2 C) (Oral)   Resp 16   LMP 07/14/2019   SpO2 100%     Physical Exam Vitals signs reviewed.  Constitutional:      General: She is not in acute distress.    Appearance: She is normal weight. She is not ill-appearing.  HENT:     Head: Normocephalic and atraumatic.     Right Ear: Tympanic membrane, ear canal and external ear normal.     Left Ear: Tympanic membrane, ear canal and external ear normal.     Nose: Nose normal.     Mouth/Throat:     Mouth: Mucous membranes are moist.     Pharynx: Oropharynx is clear. No oropharyngeal exudate or posterior oropharyngeal erythema.  Eyes:     General: No scleral icterus.       Right eye: No discharge.        Left eye: No discharge.     Extraocular Movements: Extraocular movements intact.     Conjunctiva/sclera: Conjunctivae normal.     Pupils: Pupils are equal, round, and reactive to light.  Neck:     Musculoskeletal: Normal range of motion and neck supple. No neck rigidity or muscular tenderness.  Cardiovascular:     Rate and Rhythm: Normal rate and regular rhythm.     Pulses: Normal pulses.     Heart sounds: Normal heart sounds. No murmur.  Pulmonary:     Effort: Pulmonary effort is normal. No respiratory distress.     Breath sounds: No  wheezing or rhonchi.  Chest:     Chest wall: No tenderness.  Abdominal:     General: Abdomen is flat. Bowel sounds are normal. There is no distension.     Palpations: Abdomen is soft.     Tenderness: There is no abdominal tenderness. There is no right CVA tenderness, left CVA tenderness or guarding.  Musculoskeletal:     Comments: Full active range of motion of upper and lower extremities with 5/5 strength bilaterally and symmetric.  Lymphadenopathy:     Cervical:  No cervical adenopathy.  Skin:    General: Skin is warm.     Capillary Refill: Capillary refill takes less than 2 seconds.     Coloration: Skin is not jaundiced or pale.     Findings: No bruising or rash.  Neurological:     General: No focal deficit present.     Mental Status: She is alert and oriented to person, place, and time.     Cranial Nerves: No cranial nerve deficit.     Sensory: No sensory deficit.     Motor: No weakness.     Coordination: Coordination normal.     Gait: Gait normal.     Deep Tendon Reflexes: Reflexes normal.     Comments: Negative Patrick test, symptoms not reproducible during exam  Psychiatric:        Mood and Affect: Mood normal.        Thought Content: Thought content normal.        Judgment: Judgment normal.      UC Treatments / Results  Labs (all labs ordered are listed, but only abnormal results are displayed) Labs Reviewed  POCT URINALYSIS DIP (MANUAL ENTRY) - Abnormal; Notable for the following components:      Result Value   Ketones, POC UA small (15) (*)    Blood, UA trace-intact (*)    All other components within normal limits  POCT FASTING CBG [facility] MANUAL ENTRY - Abnormal; Notable for the following components:   POCT Glucose ([facility]) 102 (*)    All other components within normal limits  NOVEL CORONAVIRUS, NAA    EKG   Radiology No results found.  Procedures Procedures (including critical care time)  Medications Ordered in UC Medications - No data to display   Initial Impression / Assessment and Plan / UC Course  I have reviewed the triage vital signs and the nursing notes.  Pertinent labs & imaging results that were available during my care of the patient were reviewed by me and considered in my medical decision making (see chart for details).     1.  Dizziness Patient hemodynamically stable, no neurocognitive deficits on exam.  Patient does not appear ill/toxic.  CBG done in office, reviewed by me: 102, last ate 2 hours prior.  Low concern for diabetes at this time.  Urinalysis unremarkable for leukocytes or nitrates, culture deferred.  CBC deferred at this time as patient denies heavy bleeding with recent menstrual cycle, and CBCs showing hemoglobin > 15 since 06/16/2017.  EKG done in office, reviewed by me and consulted with Jaynee Eagles, PA-C.  No previous to compare to: Normal sinus rhythm with right atrial enlargement.  No QTC prolongation, ST elevation.  Patient not actively complaining of chest pain, palpitations.  Reviewed findings with patient, who verbalized understanding: Stressed importance of keeping PCP appointment as cardiology referral may be indicated.  Patient does not have a PCP: This provider coordinated care, and pt now has appointment with primary care on 9/1.  Patient informed of appointment date and time as listed below, intends to keep this appointment.  Will trial meclizine for now and have patient keep symptom log.  Return precautions discussed, patient verbalized understanding and is agreeable to plan. Final Clinical Impressions(s) / UC Diagnoses   Final diagnoses:  Dizziness     Discharge Instructions     Partner to maintain good water intake. Keep symptom log: When you experience symptoms, how long they last, what you are doing with a starter, what you did to  resolve them. Bring this to PCP appointment on 9/1.    ED Prescriptions    Medication Sig Dispense Auth. Provider   meclizine (ANTIVERT) 12.5 MG tablet Take 1  tablet (12.5 mg total) by mouth 3 (three) times daily as needed for dizziness. 30 tablet Hall-Potvin, [place], PA-C     Controlled Substance Prescriptions [place] Controlled Substance Registry consulted? Not Applicable   Quincy Sheehan, PA-C 07/20/19 [address] [place], [place] 07/20/19 2046

## 2019-07-21 ENCOUNTER — Encounter (HOSPITAL_COMMUNITY): Payer: Self-pay

## 2019-07-21 LAB — NOVEL CORONAVIRUS, NAA: SARS-CoV-2, NAA: NOT DETECTED

## 2019-07-27 ENCOUNTER — Encounter: Payer: 59 | Admitting: Internal Medicine

## 2019-07-28 NOTE — Progress Notes (Signed)
Erroneous

## 2019-08-10 ENCOUNTER — Ambulatory Visit: Payer: 59 | Admitting: Family Medicine

## 2019-11-22 ENCOUNTER — Other Ambulatory Visit: Payer: Self-pay

## 2019-11-22 ENCOUNTER — Ambulatory Visit
Admission: EM | Admit: 2019-11-22 | Discharge: 2019-11-22 | Disposition: A | Discharge status: Home / Self Care | Payer: PRIVATE HEALTH INSURANCE | Attending: Emergency Medicine | Admitting: Emergency Medicine

## 2019-11-22 ENCOUNTER — Encounter: Payer: Self-pay | Admitting: Emergency Medicine

## 2019-11-22 DIAGNOSIS — Z20822 Contact with and (suspected) exposure to covid-19: Secondary | ICD-10-CM

## 2019-11-22 DIAGNOSIS — Z20828 Contact with and (suspected) exposure to other viral communicable diseases: Secondary | ICD-10-CM

## 2019-11-22 NOTE — ED Triage Notes (Signed)
Pt presents to [facility] for COVID testing after an exposure on 12/22, mom was positive.  Denies any symptoms.

## 2019-11-22 NOTE — Discharge Instructions (Addendum)
Your COVID test is pending - it is important to quarantine / isolate at home until your results are back. °If you test positive and would like further evaluation for persistent or worsening symptoms, you may schedule an E-visit or virtual (video) visit throughout the [facility] MyChart app or website. ° °PLEASE NOTE: If you develop severe chest pain or shortness of breath please go to the ER or call 9-1-1 for further evaluation --> DO NOT schedule electronic or virtual visits for this. °Please call our office for further guidance / recommendations as needed. °

## 2019-11-22 NOTE — ED Provider Notes (Signed)
[facility]    CSN: [id number] Arrival date & time: 11/22/19  1029      History   Chief Complaint Chief Complaint  Patient presents with  . Exposure to COVID    HPI Katherine Holmes is a 38 y.o. female   Presenting for Covid testing: Exposure: mother Date of exposure: 12/22 Any fever, symptoms since exposure: No No additional questions regarding testing at this time.  Past Medical History:  Diagnosis Date  . Anemia   . Endometriosis   . Medical history non-contributory     There are no problems to display for this patient.   Past Surgical History:  Procedure Laterality Date  . LAPAROSCOPIC LYSIS OF ADHESIONS N/A 04/02/2013   Procedure: LAPAROSCOPIC LYSIS OF ADHESIONS;  Surgeon: Governor Specking, MD;  Location: [facility];  Service: Gynecology;  Laterality: N/A;  . LAPAROSCOPY N/A 04/02/2013   Procedure: Operative Laparoscopy with Gel Port-Assisted Myomectomy and Excision of Endometriosis;  Surgeon: Governor Specking, MD;  Location: [facility];  Service: Gynecology;  Laterality: N/A;  . MYOMECTOMY N/A 04/02/2013   Procedure: ASSISTED MYOMECTOMY;  Surgeon: Governor Specking, MD;  Location: [facility];  Service: Gynecology;  Laterality: N/A;  . NO PAST SURGERIES      OB History   No obstetric history on file.      Home Medications    Prior to Admission medications   Medication Sig Start Date End Date Taking? Authorizing Provider  acyclovir (ZOVIRAX) 400 MG tablet Take 400 mg by mouth 2 (two) times daily.    [provider]  levonorgestrel (MIRENA) 20 MCG/24HR IUD 1 each by Intrauterine route once.    [provider]  meclizine (ANTIVERT) 12.5 MG tablet Take 1 tablet (12.5 mg total) by mouth 3 (three) times daily as needed for dizziness. 07/20/19   Hall-Potvin, [place], PA-C  spironolactone (ALDACTONE) 50 MG tablet Take 50 mg by mouth daily.    [provider]  estradiol (ESTRACE) 1 MG tablet Take 1 mg by mouth daily as needed  (menstration).  07/20/19  [provider]  medroxyPROGESTERone (DEPO-PROVERA) 150 MG/ML injection Inject 150 mg into the muscle every 3 (three) months.  07/20/19  [provider]    Family History Family History  Problem Relation Age of Onset  . Diabetes Mother   . Hypertension Mother   . Hypertension Father   . Hypertension Sister   . Cancer Maternal Grandmother     Social History Social History   Tobacco Use  . Smoking status: Current Every Day Smoker    Packs/day: 0.25    Types: Cigarettes  . Smokeless tobacco: Never Used  Substance Use Topics  . Alcohol use: Yes    Comment: occasionally  . Drug use: No     Allergies   Patient has no known allergies.   Review of Systems Review of Systems  Constitutional: Negative for fatigue and fever.  HENT: Negative for ear pain, sinus pain, sore throat and voice change.   Eyes: Negative for pain, redness and visual disturbance.  Respiratory: Negative for cough and shortness of breath.   Cardiovascular: Negative for chest pain and palpitations.  Gastrointestinal: Negative for abdominal pain, diarrhea and vomiting.  Musculoskeletal: Negative for arthralgias and myalgias.  Skin: Negative for rash and wound.  Neurological: Negative for syncope and headaches.     Physical Exam Triage Vital Signs ED Triage Vitals  Enc Vitals Group     BP 11/22/19 1054 118/74     Pulse Rate 11/22/19 1054  90     Resp 11/22/19 1054 16     Temp 11/22/19 1054 97.8 F (36.6 C)     Temp Source 11/22/19 1054 Temporal     SpO2 11/22/19 1054 98 %     Weight --      Height --      Head Circumference --      Peak Flow --      Pain Score 11/22/19 1055 0     Pain Loc --      Pain Edu? --      Excl. in [place]? --    No data found.  Updated Vital Signs BP 118/74 (BP Location: Left Arm)   Pulse 90   Temp 97.8 F (36.6 C) (Temporal)   Resp 16   SpO2 98%   Visual Acuity Right Eye Distance:   Left Eye Distance:   Bilateral  Distance:    Right Eye Near:   Left Eye Near:    Bilateral Near:     Physical Exam Constitutional:      General: She is not in acute distress. HENT:     Head: Normocephalic and atraumatic.  Eyes:     General: No scleral icterus.    Pupils: Pupils are equal, round, and reactive to light.  Cardiovascular:     Rate and Rhythm: Normal rate.  Pulmonary:     Effort: Pulmonary effort is normal.  Skin:    Coloration: Skin is not jaundiced or pale.  Neurological:     Mental Status: She is alert and oriented to person, place, and time.      UC Treatments / Results  Labs (all labs ordered are listed, but only abnormal results are displayed) Labs Reviewed  NOVEL CORONAVIRUS, NAA    EKG   Radiology No results found.  Procedures Procedures (including critical care time)  Medications Ordered in UC Medications - No data to display  Initial Impression / Assessment and Plan / UC Course  I have reviewed the triage vital signs and the nursing notes.  Pertinent labs & imaging results that were available during my care of the patient were reviewed by me and considered in my medical decision making (see chart for details).     Patient afebrile, nontoxic, with SpO2 98%.  Covid PCR pending.  Patient to quarantine until results are back.  Reviewed supportive management.  Return precautions discussed, patient verbalized understanding and is agreeable to plan. Final Clinical Impressions(s) / UC Diagnoses   Final diagnoses:  Exposure to COVID-19 virus     Discharge Instructions     Your COVID test is pending - it is important to quarantine / isolate at home until your results are back. If you test positive and would like further evaluation for persistent or worsening symptoms, you may schedule an E-visit or virtual (video) visit throughout the [facility] app or website.  PLEASE NOTE: If you develop severe chest pain or shortness of breath please go to the ER or call 9-1-1  for further evaluation --> DO NOT schedule electronic or virtual visits for this. Please call our office for further guidance / recommendations as needed.    ED Prescriptions    None     PDMP not reviewed this encounter.   Hall-Potvin, [place], [place] 11/22/19 1109

## 2019-11-22 NOTE — ED Notes (Signed)
Patient able to ambulate independently  

## 2019-11-24 LAB — NOVEL CORONAVIRUS, NAA: SARS-CoV-2, NAA: NOT DETECTED

## 2021-02-14 DIAGNOSIS — F17210 Nicotine dependence, cigarettes, uncomplicated: Secondary | ICD-10-CM | POA: Insufficient documentation

## 2021-02-14 DIAGNOSIS — B009 Herpesviral infection, unspecified: Secondary | ICD-10-CM | POA: Insufficient documentation

## 2021-05-25 DIAGNOSIS — Z800 Family history of malignant neoplasm of digestive organs: Secondary | ICD-10-CM | POA: Insufficient documentation

## 2021-08-10 HISTORY — PX: COLONOSCOPY: SHX174

## 2021-10-02 DIAGNOSIS — K580 Irritable bowel syndrome with diarrhea: Secondary | ICD-10-CM | POA: Insufficient documentation

## 2022-01-21 ENCOUNTER — Other Ambulatory Visit (HOSPITAL_COMMUNITY): Payer: Self-pay

## 2022-05-02 DIAGNOSIS — N920 Excessive and frequent menstruation with regular cycle: Secondary | ICD-10-CM | POA: Insufficient documentation

## 2022-05-02 DIAGNOSIS — R61 Generalized hyperhidrosis: Secondary | ICD-10-CM | POA: Insufficient documentation

## 2022-05-17 DIAGNOSIS — D259 Leiomyoma of uterus, unspecified: Secondary | ICD-10-CM | POA: Insufficient documentation

## 2022-06-24 ENCOUNTER — Other Ambulatory Visit: Payer: Self-pay | Admitting: Obstetrics and Gynecology

## 2022-06-24 DIAGNOSIS — Z01818 Encounter for other preprocedural examination: Secondary | ICD-10-CM

## 2022-07-03 ENCOUNTER — Encounter (HOSPITAL_BASED_OUTPATIENT_CLINIC_OR_DEPARTMENT_OTHER): Payer: Self-pay | Admitting: Obstetrics and Gynecology

## 2022-07-03 DIAGNOSIS — K589 Irritable bowel syndrome without diarrhea: Secondary | ICD-10-CM

## 2022-07-03 HISTORY — DX: Irritable bowel syndrome, unspecified: K58.9

## 2022-07-08 ENCOUNTER — Encounter (HOSPITAL_BASED_OUTPATIENT_CLINIC_OR_DEPARTMENT_OTHER): Payer: Self-pay | Admitting: Obstetrics and Gynecology

## 2022-07-08 ENCOUNTER — Other Ambulatory Visit: Payer: Self-pay

## 2022-07-08 NOTE — Progress Notes (Signed)
Spoke w/ via phone for pre-op interview---[facility] needs dos----urine pregnancy             Lab results------07/15/22 lab appt for cbc, type & screen COVID test -----patient states asymptomatic no test needed Arrive at -------0800 on Wednesday, 07/17/22 NPO after MN NO Solid Food.  Clear liquids from MN until---0700 Med rec completed Medications to take morning of surgery -----Zovirax Diabetic medication -----n/a Patient instructed no nail polish to be worn day of surgery Patient instructed to bring photo id and insurance card day of surgery Patient aware to have Driver (ride ) / caregiver    for 24 hours after surgery - mother, Neoma Laming Patient Special Instructions -----Extended / overnight stay instructions given. Pre-Op special Istructions -----Patient has a gold piercing in her ear that she does not know if she can get out. She is willing to take it out and stated she may need help to try. Patient verbalized understanding of instructions that were given at this phone interview. Patient denies shortness of breath, chest pain, fever, cough at this phone interview.

## 2022-07-08 NOTE — Progress Notes (Signed)
Your procedure is scheduled on Wednesday, 07/17/22.  Report to [facility].   Call this number if you have problems the morning of surgery  :[phone].   OUR ADDRESS IS [address].  WE ARE LOCATED IN [facility].  PLEASE BRING YOUR INSURANCE CARD AND PHOTO ID DAY OF SURGERY.  ONLY 2 PEOPLE ARE ALLOWED IN  WAITING  ROOM.                                      REMEMBER:  DO NOT EAT FOOD, CANDY GUM OR MINTS  AFTER MIDNIGHT THE NIGHT BEFORE YOUR SURGERY . YOU MAY HAVE CLEAR LIQUIDS FROM MIDNIGHT THE NIGHT BEFORE YOUR SURGERY UNTIL  7:00 AM. NO CLEAR LIQUIDS AFTER   7:00 AM DAY OF SURGERY.  YOU MAY  BRUSH YOUR TEETH MORNING OF SURGERY AND RINSE YOUR MOUTH OUT, NO CHEWING GUM CANDY OR MINTS.     CLEAR LIQUID DIET   Foods Allowed                                                                     Foods Excluded  Coffee and tea, regular and decaf                             liquids that you cannot  Plain Jell-O                                                                   see through such as: Fruit ices (not with fruit pulp)                                     milk, soups, orange juice  Plain  Popsicles                                    All solid food Carbonated beverages, regular and diet                                    Cranberry, grape and apple juices Sports drinks like Gatorade _____________________________________________________________________     TAKE THESE MEDICATIONS MORNING OF SURGERY: Acyclovir   UP TO 4 VISITORS  MAY VISIT IN THE EXTENDED RECOVERY ROOM UNTIL 800 PM ONLY.  ONE  VISITOR AGE 42 AND OVER MAY SPEND THE NIGHT AND MUST BE IN EXTENDED RECOVERY ROOM NO LATER THAN 800 PM . YOUR DISCHARGE TIME AFTER YOU SPEND THE NIGHT IS 900 AM THE MORNING AFTER YOUR SURGERY.  YOU MAY PACK A SMALL OVERNIGHT BAG WITH TOILETRIES FOR YOUR OVERNIGHT STAY IF YOU  WISH.  YOUR PRESCRIPTION MEDICATIONS WILL BE PROVIDED  DURING [facility].                                      DO NOT WEAR JEWERLY, MAKE UP. DO NOT WEAR LOTIONS, POWDERS, PERFUMES OR NAIL POLISH ON YOUR FINGERNAILS. TOENAIL POLISH IS OK TO WEAR. DO NOT SHAVE FOR 48 HOURS PRIOR TO DAY OF SURGERY. MEN MAY SHAVE FACE AND NECK. CONTACTS, GLASSES, OR DENTURES MAY NOT BE WORN TO SURGERY.  REMEMBER: NO SMOKING, DRUGS OR ALCOHOL FOR 24 HOURS BEFORE YOUR SURGERY.                                    [facility] IS NOT RESPONSIBLE  FOR ANY BELONGINGS.                                                                    Marland Kitchen           [facility] - Preparing for Surgery Before surgery, you can play an important role.  Because skin is not sterile, your skin needs to be as free of germs as possible.  You can reduce the number of germs on your skin by washing with CHG (chlorahexidine gluconate) soap before surgery.  CHG is an antiseptic cleaner which kills germs and bonds with the skin to continue killing germs even after washing. Please DO NOT use if you have an allergy to CHG or antibacterial soaps.  If your skin becomes reddened/irritated stop using the CHG and inform your nurse when you arrive at Short Stay. Do not shave (including legs and underarms) for at least 48 hours prior to the first CHG shower.  You may shave your face/neck. Please follow these instructions carefully:  1.  Shower with CHG Soap the night before surgery and the  morning of Surgery.  2.  If you choose to wash your hair, wash your hair first as usual with your  normal  shampoo.  3.  After you shampoo, rinse your hair and body thoroughly to remove the  shampoo.                            4.  Use CHG as you would any other liquid soap.  You can apply chg directly  to the skin and wash , please wash your belly button thoroughly with chg soap provided night before and morning of your surgery.                     Gently with a scrungie or clean washcloth.  5.  Apply the CHG Soap to your  body ONLY FROM THE NECK DOWN.   Do not use on face/ open                           Wound or open sores. Avoid contact with eyes, ears mouth and genitals (private parts).  Wash face,  Genitals (private parts) with your normal soap.             6.  Wash thoroughly, paying special attention to the area where your surgery  will be performed.  7.  Thoroughly rinse your body with warm water from the neck down.  8.  DO NOT shower/wash with your normal soap after using and rinsing off  the CHG Soap.                9.  Pat yourself dry with a clean towel.            10.  Wear clean pajamas.            11.  Place clean sheets on your bed the night of your first shower and do not  sleep with pets. Day of Surgery : Do not apply any lotions/deodorants the morning of surgery.  Please wear clean clothes to the hospital/surgery center.  IF YOU HAVE ANY SKIN IRRITATION OR PROBLEMS WITH THE SURGICAL SOAP, PLEASE GET A BAR OF GOLD DIAL SOAP AND SHOWER THE NIGHT BEFORE YOUR SURGERY AND THE MORNING OF YOUR SURGERY. PLEASE LET THE NURSE KNOW MORNING OF YOUR SURGERY IF YOU HAD ANY PROBLEMS WITH THE SURGICAL SOAP.  IF YOU OR ANYONE IN YOUR HOUSEHOLD DEVELOPS SYMPTOMS OF COVID OR TESTS POSITIVE, PLEASE CALL YOUR SURGEON IMMEDIATELY.  ________________________________________________________________________                                                        QUESTIONS [facility] PRE OP NURSE PHONE [phone].

## 2022-07-15 ENCOUNTER — Encounter (HOSPITAL_COMMUNITY)
Admission: RE | Admit: 2022-07-15 | Discharge: 2022-07-15 | Disposition: A | Discharge status: Home / Self Care | Payer: PRIVATE HEALTH INSURANCE | Source: Ambulatory Visit | Attending: Obstetrics and Gynecology | Admitting: Obstetrics and Gynecology

## 2022-07-15 DIAGNOSIS — Z01818 Encounter for other preprocedural examination: Secondary | ICD-10-CM

## 2022-07-15 DIAGNOSIS — Z01812 Encounter for preprocedural laboratory examination: Secondary | ICD-10-CM | POA: Insufficient documentation

## 2022-07-15 LAB — CBC
HCT: 29.3 % — ABNORMAL LOW (ref 36.0–46.0)
Hemoglobin: 9.2 g/dL — ABNORMAL LOW (ref 12.0–15.0)
MCH: 27.8 pg (ref 26.0–34.0)
MCHC: 31.4 g/dL (ref 30.0–36.0)
MCV: 88.5 fL (ref 80.0–100.0)
Platelets: 245 10*3/uL (ref 150–400)
RBC: 3.31 MIL/uL — ABNORMAL LOW (ref 3.87–5.11)
RDW: 17.6 % — ABNORMAL HIGH (ref 11.5–15.5)
WBC: 5.9 10*3/uL (ref 4.0–10.5)
nRBC: 0 % (ref 0.0–0.2)

## 2022-07-15 NOTE — Progress Notes (Signed)
Routed abnormal CBC result done today , 07-15-2022, epic, to Dr Philis Pique in epic,

## 2022-07-16 NOTE — H&P (Signed)
41 y.o.  with symptomatic fibroid uterus.  Previously:"Pt moving toward hysterectomy; salpingectomies; has endometriosis from last surgery, consider BSO but not bad pain with periods but did have cramps and bloating with periods. But not as bad as years ago and main problem is the excessive bleeding. Continue POPs for now. Patch to quit smoking. menorrhagia Order [place], pelvis, transabdominal + transvaginal intramural leiomyoma of uterus Provided uterine fibroids: care instructions Order robotic assisted hysterectomy with salpingectomy (SURG) trying to give up smoking Start nicotine 7 mg/24 hr daily transdermal patch 1 patch every day History of Present Illness GYN scan today d/t menorrhagia with fibroids. she is currently on ortho micronor [facility] 05/02/22 confirmed not menopausal CBC 05/02/22 no anemia -AG//  Physician interpretation: [id number]; EM 1.9 cm with increased blood flow. Simple cyst LO 3.6 cm. Multiple fibroids, largest is post fundus, 3.4 cm. No submucosal. No other masses./mah S/p myomectomy. "  Stopped POPS. Stopped smoking. Pt had endometriosis in past on two laparoscopy. Will do LUQ puncture. Voiding a lot.   Past Medical History:  Diagnosis Date   Anemia    hgb 10.6 on 02/14/21   Endometriosis    Genital herpes    takes daily acyclovir   Heart murmur    as a baby   IBS (irritable bowel syndrome) 07/03/2022   w/diarrhea, LOV with Dr. Eusebio Friendly at [facility], 10/02/2021   Past Surgical History:  Procedure Laterality Date   COLONOSCOPY  08/10/2021   normal   LAPAROSCOPIC LYSIS OF ADHESIONS N/A 04/02/2013   Procedure: LAPAROSCOPIC LYSIS OF ADHESIONS;  Surgeon: Governor Specking, MD;  Location: [facility];  Service: Gynecology;  Laterality: N/A;   LAPAROSCOPY N/A 04/02/2013   Procedure: Operative Laparoscopy with Gel Port-Assisted Myomectomy and Excision of Endometriosis;  Surgeon: Governor Specking, MD;  Location: [facility];  Service: Gynecology;  Laterality:  N/A;   MYOMECTOMY N/A 04/02/2013   Procedure: ASSISTED MYOMECTOMY;  Surgeon: Governor Specking, MD;  Location: [facility];  Service: Gynecology;  Laterality: N/A;    Social History   Socioeconomic History   Marital status: Widowed    Spouse name: Not on file   Number of children: Not on file   Years of education: Not on file   Highest education level: Not on file  Occupational History   Not on file  Tobacco Use   Smoking status: Former    Packs/day: 1.00    Years: 24.00    Total pack years: 24.00    Types: Cigarettes   Smokeless tobacco: Never  Vaping Use   Vaping Use: Former  Substance and Sexual Activity   Alcohol use: Not Currently   Drug use: No   Sexual activity: Not on file  Other Topics Concern   Not on file  Social History Narrative   Not on file   Social Determinants of Health   Financial Resource Strain: Not on file  Food Insecurity: Not on file  Transportation Needs: Not on file  Physical Activity: Not on file  Stress: Not on file  Social Connections: Not on file  Intimate Partner Violence: Not on file    No current facility-administered medications on file prior to encounter.   Current Outpatient Medications on File Prior to Encounter  Medication Sig Dispense Refill   Emollient (COLLAGEN EX) Apply topically.     Multiple Vitamin (MULTIVITAMIN WITH MINERALS) TABS tablet Take 1 tablet by mouth daily.     acyclovir (ZOVIRAX) 400 MG tablet Take 400 mg by mouth 2 (two) times daily.     [  DISCONTINUED] estradiol (ESTRACE) 1 MG tablet Take 1 mg by mouth daily as needed (menstration).     [DISCONTINUED] medroxyPROGESTERone (DEPO-PROVERA) 150 MG/ML injection Inject 150 mg into the muscle every 3 (three) months.      No Known Allergies  Vitals:   07/08/22 0946  Weight: 57.2 kg  Height: '5\' 7"'$  (1.702 m)    Lungs: clear to ascultation Cor:  RRR Abdomen:  soft, nontender, nondistended. Ex:  no cords, erythema Pelvic:   Vulva: no masses, no atrophy, no  lesions Vagina: no tenderness, no erythema, no abnormal vaginal discharge, no vesicle(s) or ulcers, no cystocele, no rectocele Cervix: grossly normal, no discharge, no cervical motion tenderness Uterus: normal size (9), normal shape, midline, no uterine prolapse, non-tender Bladder/Urethra: normal meatus, no urethral discharge, no urethral mass, bladder non distended, Urethra well supported Adnexa/Parametria: no parametrial tenderness, no parametrial mass, no adnexal tenderness, no ovarian mass   A:  For robotic TLH, bilateral salpingectomies, cystoscopy.   P: P: All risks, benefits and alternatives d/w patient and she desires to proceed.  Patient has undergone a modified diet, ERAS protocol and will receive preop antibiotics and SCDs during the operation.   Pt to have extended recovery but will go home same day if eating, ambulating, voiding and pain control is good.  Daria Pastures

## 2022-07-17 ENCOUNTER — Other Ambulatory Visit: Payer: Self-pay

## 2022-07-17 ENCOUNTER — Ambulatory Visit (HOSPITAL_BASED_OUTPATIENT_CLINIC_OR_DEPARTMENT_OTHER): Payer: PRIVATE HEALTH INSURANCE | Admitting: Anesthesiology

## 2022-07-17 ENCOUNTER — Ambulatory Visit (HOSPITAL_BASED_OUTPATIENT_CLINIC_OR_DEPARTMENT_OTHER)
Admission: RE | Admit: 2022-07-17 | Discharge: 2022-07-17 | Disposition: A | Discharge status: Home / Self Care | Payer: PRIVATE HEALTH INSURANCE | Attending: Obstetrics and Gynecology | Admitting: Obstetrics and Gynecology

## 2022-07-17 ENCOUNTER — Encounter (HOSPITAL_BASED_OUTPATIENT_CLINIC_OR_DEPARTMENT_OTHER): Payer: Self-pay | Admitting: Obstetrics and Gynecology

## 2022-07-17 ENCOUNTER — Encounter (HOSPITAL_BASED_OUTPATIENT_CLINIC_OR_DEPARTMENT_OTHER)
Admission: RE | Disposition: A | Discharge status: Home / Self Care | Payer: Self-pay | Source: Home / Self Care | Attending: Obstetrics and Gynecology

## 2022-07-17 DIAGNOSIS — Z01818 Encounter for other preprocedural examination: Secondary | ICD-10-CM

## 2022-07-17 DIAGNOSIS — N8301 Follicular cyst of right ovary: Secondary | ICD-10-CM | POA: Insufficient documentation

## 2022-07-17 DIAGNOSIS — N80201 Endometriosis of right fallopian tube, unspecified depth: Secondary | ICD-10-CM | POA: Insufficient documentation

## 2022-07-17 DIAGNOSIS — D251 Intramural leiomyoma of uterus: Secondary | ICD-10-CM | POA: Insufficient documentation

## 2022-07-17 DIAGNOSIS — K589 Irritable bowel syndrome without diarrhea: Secondary | ICD-10-CM | POA: Insufficient documentation

## 2022-07-17 DIAGNOSIS — N736 Female pelvic peritoneal adhesions (postinfective): Secondary | ICD-10-CM | POA: Insufficient documentation

## 2022-07-17 DIAGNOSIS — Z9889 Other specified postprocedural states: Secondary | ICD-10-CM

## 2022-07-17 DIAGNOSIS — G8918 Other acute postprocedural pain: Secondary | ICD-10-CM | POA: Insufficient documentation

## 2022-07-17 DIAGNOSIS — N946 Dysmenorrhea, unspecified: Secondary | ICD-10-CM | POA: Insufficient documentation

## 2022-07-17 DIAGNOSIS — Z87891 Personal history of nicotine dependence: Secondary | ICD-10-CM | POA: Diagnosis not present

## 2022-07-17 HISTORY — PX: CYSTOSCOPY: SHX5120

## 2022-07-17 HISTORY — DX: Herpesviral infection of urogenital system, unspecified: A60.00

## 2022-07-17 HISTORY — DX: Cardiac murmur, unspecified: R01.1

## 2022-07-17 HISTORY — PX: LAPAROSCOPIC LYSIS OF ADHESIONS: SHX5905

## 2022-07-17 HISTORY — PX: ROBOTIC ASSISTED TOTAL HYSTERECTOMY WITH BILATERAL SALPINGO OOPHERECTOMY: SHX6086

## 2022-07-17 LAB — TYPE AND SCREEN
ABO/RH(D): B POS
Antibody Screen: NEGATIVE

## 2022-07-17 LAB — POCT PREGNANCY, URINE: Preg Test, Ur: NEGATIVE

## 2022-07-17 SURGERY — HYSTERECTOMY, TOTAL, ROBOT-ASSISTED, LAPAROSCOPIC, WITH BILATERAL SALPINGO-OOPHORECTOMY
Anesthesia: General | Site: Bladder

## 2022-07-17 MED ORDER — FENTANYL CITRATE (PF) 100 MCG/2ML IJ SOLN
INTRAMUSCULAR | Status: DC | PRN
  Administered 2022-07-17 (×3): 50 ug via INTRAVENOUS

## 2022-07-17 MED ORDER — ONDANSETRON HCL 4 MG/2ML IJ SOLN
INTRAMUSCULAR | Status: DC | PRN
  Administered 2022-07-17: 4 mg via INTRAVENOUS

## 2022-07-17 MED ORDER — CEFAZOLIN SODIUM-DEXTROSE 2-4 GM/100ML-% IV SOLN
INTRAVENOUS | Status: AC
  Filled 2022-07-17: qty 100

## 2022-07-17 MED ORDER — CELECOXIB 200 MG PO CAPS
400.0000 mg | ORAL_CAPSULE | ORAL | Status: AC
  Administered 2022-07-17: 400 mg via ORAL

## 2022-07-17 MED ORDER — LACTATED RINGERS IV SOLN: INTRAVENOUS | Status: DC

## 2022-07-17 MED ORDER — SODIUM CHLORIDE 0.9 % IV SOLN
INTRAVENOUS | Status: DC | PRN
  Administered 2022-07-17: 60 mL

## 2022-07-17 MED ORDER — ONDANSETRON HCL 4 MG/2ML IJ SOLN
INTRAMUSCULAR | Status: AC
  Filled 2022-07-17: qty 2

## 2022-07-17 MED ORDER — MIDAZOLAM HCL 2 MG/2ML IJ SOLN
INTRAMUSCULAR | Status: AC
  Filled 2022-07-17: qty 2

## 2022-07-17 MED ORDER — OXYCODONE HCL 5 MG PO TABS
5.0000 mg | ORAL_TABLET | ORAL | Status: DC | PRN
  Administered 2022-07-17: 10 mg via ORAL

## 2022-07-17 MED ORDER — LIDOCAINE HCL (PF) 2 % IJ SOLN
INTRAMUSCULAR | Status: AC
  Filled 2022-07-17: qty 5

## 2022-07-17 MED ORDER — IBUPROFEN 800 MG PO TABS
ORAL_TABLET | ORAL | Status: AC
  Filled 2022-07-17: qty 1

## 2022-07-17 MED ORDER — FENTANYL CITRATE (PF) 100 MCG/2ML IJ SOLN
INTRAMUSCULAR | Status: AC
  Filled 2022-07-17: qty 2

## 2022-07-17 MED ORDER — PROMETHAZINE HCL 25 MG/ML IJ SOLN: 6.2500 mg | INTRAMUSCULAR | Status: DC | PRN

## 2022-07-17 MED ORDER — EPHEDRINE SULFATE (PRESSORS) 50 MG/ML IJ SOLN
INTRAMUSCULAR | Status: DC | PRN
  Administered 2022-07-17 (×2): 10 mg via INTRAVENOUS

## 2022-07-17 MED ORDER — SOD CITRATE-CITRIC ACID 500-334 MG/5ML PO SOLN: 30.0000 mL | ORAL | Status: DC

## 2022-07-17 MED ORDER — SUGAMMADEX SODIUM 200 MG/2ML IV SOLN
INTRAVENOUS | Status: DC | PRN
  Administered 2022-07-17: 200 mg via INTRAVENOUS

## 2022-07-17 MED ORDER — PROPOFOL 10 MG/ML IV BOLUS
INTRAVENOUS | Status: DC | PRN
  Administered 2022-07-17: 200 mg via INTRAVENOUS

## 2022-07-17 MED ORDER — GABAPENTIN 300 MG PO CAPS
ORAL_CAPSULE | ORAL | Status: AC
  Filled 2022-07-17: qty 1

## 2022-07-17 MED ORDER — SCOPOLAMINE 1 MG/3DAYS TD PT72
MEDICATED_PATCH | TRANSDERMAL | Status: AC
  Filled 2022-07-17: qty 1

## 2022-07-17 MED ORDER — KETOROLAC TROMETHAMINE 15 MG/ML IJ SOLN: 15.0000 mg | INTRAMUSCULAR | Status: DC

## 2022-07-17 MED ORDER — LIDOCAINE 2% (20 MG/ML) 5 ML SYRINGE
INTRAMUSCULAR | Status: DC | PRN
  Administered 2022-07-17: 60 mg via INTRAVENOUS

## 2022-07-17 MED ORDER — CEFAZOLIN SODIUM-DEXTROSE 2-4 GM/100ML-% IV SOLN
2.0000 g | INTRAVENOUS | Status: AC
  Administered 2022-07-17: 2 g via INTRAVENOUS

## 2022-07-17 MED ORDER — MENTHOL 3 MG MT LOZG: 1.0000 | LOZENGE | OROMUCOSAL | Status: DC | PRN

## 2022-07-17 MED ORDER — FLUORESCEIN SODIUM 10 % IV SOLN
INTRAVENOUS | Status: DC | PRN
  Administered 2022-07-17: 100 mg via INTRAVENOUS

## 2022-07-17 MED ORDER — CELECOXIB 200 MG PO CAPS
ORAL_CAPSULE | ORAL | Status: AC
  Filled 2022-07-17: qty 2

## 2022-07-17 MED ORDER — ACETAMINOPHEN 500 MG PO TABS
ORAL_TABLET | ORAL | Status: AC
  Filled 2022-07-17: qty 2

## 2022-07-17 MED ORDER — DEXAMETHASONE SODIUM PHOSPHATE 10 MG/ML IJ SOLN
INTRAMUSCULAR | Status: DC | PRN
  Administered 2022-07-17: 5 mg via INTRAVENOUS

## 2022-07-17 MED ORDER — SODIUM CHLORIDE 0.9 % IR SOLN
Status: DC | PRN
  Administered 2022-07-17: 1000 mL

## 2022-07-17 MED ORDER — MIDAZOLAM HCL 2 MG/2ML IJ SOLN
INTRAMUSCULAR | Status: DC | PRN
  Administered 2022-07-17: 2 mg via INTRAVENOUS

## 2022-07-17 MED ORDER — OXYCODONE HCL 5 MG PO TABS
5.0000 mg | ORAL_TABLET | Freq: Once | ORAL | 0 refills | Status: DC | PRN
Start: 2022-07-17 — End: 2022-07-17

## 2022-07-17 MED ORDER — ROCURONIUM BROMIDE 10 MG/ML (PF) SYRINGE
PREFILLED_SYRINGE | INTRAVENOUS | Status: DC | PRN
  Administered 2022-07-17: 50 mg via INTRAVENOUS

## 2022-07-17 MED ORDER — FLUORESCEIN SODIUM 10 % IV SOLN
INTRAVENOUS | Status: AC
  Filled 2022-07-17: qty 5

## 2022-07-17 MED ORDER — ROCURONIUM BROMIDE 10 MG/ML (PF) SYRINGE
PREFILLED_SYRINGE | INTRAVENOUS | Status: AC
  Filled 2022-07-17: qty 10

## 2022-07-17 MED ORDER — ACETAMINOPHEN 500 MG PO TABS
1000.0000 mg | ORAL_TABLET | Freq: Once | ORAL | Status: AC
  Administered 2022-07-17: 1000 mg via ORAL

## 2022-07-17 MED ORDER — HEMOSTATIC AGENTS (NO CHARGE) OPTIME
TOPICAL | Status: DC | PRN
  Administered 2022-07-17: 1

## 2022-07-17 MED ORDER — ACETAMINOPHEN 500 MG PO TABS: 1000.0000 mg | ORAL_TABLET | ORAL | Status: DC

## 2022-07-17 MED ORDER — OXYCODONE HCL 5 MG PO TABS: 5.0000 mg | ORAL_TABLET | Freq: Four times a day (QID) | ORAL | 0 refills | Status: DC | PRN

## 2022-07-17 MED ORDER — PROPOFOL 10 MG/ML IV BOLUS
INTRAVENOUS | Status: AC
  Filled 2022-07-17: qty 20

## 2022-07-17 MED ORDER — POVIDONE-IODINE 10 % EX SWAB: 2.0000 | Freq: Once | CUTANEOUS | Status: DC

## 2022-07-17 MED ORDER — OXYCODONE HCL 5 MG PO TABS
ORAL_TABLET | ORAL | Status: AC
  Filled 2022-07-17: qty 2

## 2022-07-17 MED ORDER — CELECOXIB 200 MG PO CAPS: 200.0000 mg | ORAL_CAPSULE | Freq: Once | ORAL | Status: DC

## 2022-07-17 MED ORDER — DEXMEDETOMIDINE (PRECEDEX) IN NS 20 MCG/5ML (4 MCG/ML) IV SYRINGE
PREFILLED_SYRINGE | INTRAVENOUS | Status: DC | PRN
  Administered 2022-07-17: 8 ug via INTRAVENOUS
  Administered 2022-07-17: 4 ug via INTRAVENOUS

## 2022-07-17 MED ORDER — FENTANYL CITRATE (PF) 100 MCG/2ML IJ SOLN
25.0000 ug | INTRAMUSCULAR | Status: DC | PRN
  Administered 2022-07-17: 25 ug via INTRAVENOUS

## 2022-07-17 MED ORDER — EPHEDRINE 5 MG/ML INJ
INTRAVENOUS | Status: AC
  Filled 2022-07-17: qty 5

## 2022-07-17 MED ORDER — ONDANSETRON HCL 4 MG/2ML IJ SOLN: 4.0000 mg | Freq: Four times a day (QID) | INTRAMUSCULAR | Status: DC | PRN

## 2022-07-17 MED ORDER — GABAPENTIN 300 MG PO CAPS
300.0000 mg | ORAL_CAPSULE | ORAL | Status: AC
  Administered 2022-07-17: 300 mg via ORAL

## 2022-07-17 MED ORDER — WHITE PETROLATUM EX OINT
TOPICAL_OINTMENT | CUTANEOUS | Status: AC
  Filled 2022-07-17: qty 5

## 2022-07-17 MED ORDER — DEXAMETHASONE SODIUM PHOSPHATE 10 MG/ML IJ SOLN
INTRAMUSCULAR | Status: AC
  Filled 2022-07-17: qty 1

## 2022-07-17 MED ORDER — IBUPROFEN 800 MG PO TABS
800.0000 mg | ORAL_TABLET | Freq: Three times a day (TID) | ORAL | Status: DC
  Administered 2022-07-17: 800 mg via ORAL

## 2022-07-17 MED ORDER — OXYCODONE HCL 5 MG/5ML PO SOLN: 5.0000 mg | Freq: Once | ORAL | Status: DC | PRN

## 2022-07-17 MED ORDER — OXYCODONE HCL 5 MG PO TABS: 5.0000 mg | ORAL_TABLET | Freq: Once | ORAL | Status: DC | PRN

## 2022-07-17 MED ORDER — LIDOCAINE HCL (PF) 2 % IJ SOLN
INTRAMUSCULAR | Status: DC | PRN
  Administered 2022-07-17: 1.5 mg/kg/h via INTRADERMAL

## 2022-07-17 MED ORDER — OXYCODONE HCL 5 MG PO TABS: 5.0000 mg | ORAL_TABLET | Freq: Once | ORAL | 0 refills | Status: DC | PRN

## 2022-07-17 MED ORDER — SCOPOLAMINE 1 MG/3DAYS TD PT72
MEDICATED_PATCH | TRANSDERMAL | Status: DC | PRN
  Administered 2022-07-17: 1 via TRANSDERMAL

## 2022-07-17 MED ORDER — ONDANSETRON HCL 4 MG PO TABS: 4.0000 mg | ORAL_TABLET | Freq: Four times a day (QID) | ORAL | Status: DC | PRN

## 2022-07-17 MED ORDER — 0.9 % SODIUM CHLORIDE (POUR BTL) OPTIME
TOPICAL | Status: DC | PRN
  Administered 2022-07-17: 500 mL

## 2022-07-17 SURGICAL SUPPLY — 70 items
ADH SKN CLS APL DERMABOND .7 (GAUZE/BANDAGES/DRESSINGS) ×3
APL SRG 38 LTWT LNG FL B (MISCELLANEOUS) ×3
APPLICATOR ARISTA FLEXITIP XL (MISCELLANEOUS) ×1 IMPLANT
BARRIER ADHS 3X4 INTERCEED (GAUZE/BANDAGES/DRESSINGS) IMPLANT
BRR ADH 4X3 ABS CNTRL BYND (GAUZE/BANDAGES/DRESSINGS)
COVER BACK TABLE 60X90IN (DRAPES) ×3 IMPLANT
COVER TIP SHEARS 8 DVNC (MISCELLANEOUS) ×3 IMPLANT
COVER TIP SHEARS 8MM DA VINCI (MISCELLANEOUS) ×3
DECANTER SPIKE VIAL GLASS SM (MISCELLANEOUS) ×3 IMPLANT
DEFOGGER SCOPE WARMER CLEARIFY (MISCELLANEOUS) ×3 IMPLANT
DERMABOND ADVANCED (GAUZE/BANDAGES/DRESSINGS) ×3
DERMABOND ADVANCED .7 DNX12 (GAUZE/BANDAGES/DRESSINGS) ×3 IMPLANT
DILATOR CANAL MILEX (MISCELLANEOUS) ×1 IMPLANT
DRAPE ARM DVNC X/XI (DISPOSABLE) ×12 IMPLANT
DRAPE COLUMN DVNC XI (DISPOSABLE) ×3 IMPLANT
DRAPE DA VINCI XI ARM (DISPOSABLE) ×12
DRAPE DA VINCI XI COLUMN (DISPOSABLE) ×3
DRAPE ROBOTICS STRL (DRAPES) ×3 IMPLANT
DRAPE UTILITY XL STRL (DRAPES) ×3 IMPLANT
DURAPREP 26ML APPLICATOR (WOUND CARE) ×3 IMPLANT
ELECT REM PT RETURN 9FT ADLT (ELECTROSURGICAL) ×3
ELECTRODE REM PT RTRN 9FT ADLT (ELECTROSURGICAL) ×3 IMPLANT
GAUZE 4X4 16PLY ~~LOC~~+RFID DBL (SPONGE) ×6 IMPLANT
GLOVE BIO SURGEON STRL SZ 6 (GLOVE) IMPLANT
GLOVE BIO SURGEON STRL SZ7 (GLOVE) ×12 IMPLANT
GLOVE BIOGEL PI IND STRL 6 (GLOVE) IMPLANT
GLOVE BIOGEL PI IND STRL 8 (GLOVE) ×3 IMPLANT
GLOVE BIOGEL PI INDICATOR 6 (GLOVE)
GLOVE BIOGEL PI INDICATOR 8 (GLOVE) ×3
HEMOSTAT ARISTA ABSORB 3G PWDR (HEMOSTASIS) ×1 IMPLANT
HIBICLENS CHG 4% 4OZ BTL (MISCELLANEOUS) ×2 IMPLANT
HOLDER FOLEY CATH W/STRAP (MISCELLANEOUS) IMPLANT
IRRIG SUCT STRYKERFLOW 2 WTIP (MISCELLANEOUS) ×3
IRRIGATION SUCT STRKRFLW 2 WTP (MISCELLANEOUS) ×3 IMPLANT
IV NS 1000ML (IV SOLUTION) ×6
IV NS 1000ML BAXH (IV SOLUTION) ×2 IMPLANT
KIT TURNOVER CYSTO (KITS) ×3 IMPLANT
LEGGING LITHOTOMY PAIR STRL (DRAPES) ×3 IMPLANT
MANIPULATOR ADVINCU DEL 2.5 PL (MISCELLANEOUS) ×1 IMPLANT
MANIPULATOR ADVINCU DEL 3.0 PL (MISCELLANEOUS) IMPLANT
MANIPULATOR ADVINCU DEL 3.5 PL (MISCELLANEOUS) IMPLANT
MANIPULATOR ADVINCU DEL 4.0 PL (MISCELLANEOUS) IMPLANT
NDL INSUFFLATION 14GA 120MM (NEEDLE) ×2 IMPLANT
NEEDLE INSUFFLATION 14GA 120MM (NEEDLE) ×3 IMPLANT
NS IRRIG 500ML POUR BTL (IV SOLUTION) ×1 IMPLANT
OBTURATOR OPTICAL STANDARD 8MM (TROCAR) ×3
OBTURATOR OPTICAL STND 8 DVNC (TROCAR) ×3
OBTURATOR OPTICALSTD 8 DVNC (TROCAR) ×3 IMPLANT
PACK ROBOT WH (CUSTOM PROCEDURE TRAY) ×3 IMPLANT
PACK ROBOTIC GOWN (GOWN DISPOSABLE) ×3 IMPLANT
PACK TRENDGUARD 450 HYBRID PRO (MISCELLANEOUS) ×1 IMPLANT
PAD OB MATERNITY 4.3X12.25 (PERSONAL CARE ITEMS) ×3 IMPLANT
PAD PREP 24X48 CUFFED NSTRL (MISCELLANEOUS) ×3 IMPLANT
POUCH LAPAROSCOPIC INSTRUMENT (MISCELLANEOUS) IMPLANT
PROTECTOR NERVE ULNAR (MISCELLANEOUS) ×6 IMPLANT
SEAL CANN UNIV 5-8 DVNC XI (MISCELLANEOUS) ×9 IMPLANT
SEAL XI 5MM-8MM UNIVERSAL (MISCELLANEOUS) ×9
SET IRRIG Y TYPE TUR BLADDER L (SET/KITS/TRAYS/PACK) ×3 IMPLANT
SET TRI-LUMEN FLTR TB AIRSEAL (TUBING) ×3 IMPLANT
SPONGE T-LAP 4X18 ~~LOC~~+RFID (SPONGE) ×3 IMPLANT
SUT VIC AB 0 CT1 36 (SUTURE) ×3 IMPLANT
SUT VICRYL RAPIDE 3 0 (SUTURE) ×6 IMPLANT
SUT VLOC 180 0 9IN  GS21 (SUTURE) ×6
SUT VLOC 180 0 9IN GS21 (SUTURE) ×4 IMPLANT
TOWEL OR 17X26 10 PK STRL BLUE (TOWEL DISPOSABLE) ×3 IMPLANT
TRAY FOLEY W/BAG SLVR 14FR LF (SET/KITS/TRAYS/PACK) ×4 IMPLANT
TRENDGUARD 450 HYBRID PRO PACK (MISCELLANEOUS) ×3
TROCAR PORT AIRSEAL 5X120 (TROCAR) ×3 IMPLANT
TROCAR Z-THREAD FIOS 5X100MM (TROCAR) IMPLANT
WATER STERILE IRR 1000ML POUR (IV SOLUTION) ×2 IMPLANT

## 2022-07-17 NOTE — Anesthesia Procedure Notes (Signed)
Procedure Name: Intubation Date/Time: 07/17/2022 10:37 AM  Performed by: Suan Halter, CRNAPre-anesthesia Checklist: Patient identified, Emergency Drugs available, Suction available and Patient being monitored Patient Re-evaluated:Patient Re-evaluated prior to induction Oxygen Delivery Method: Circle system utilized Preoxygenation: Pre-oxygenation with 100% oxygen Induction Type: IV induction Ventilation: Mask ventilation without difficulty Laryngoscope Size: Mac and 3 Grade View: Grade I Tube type: Oral Tube size: 7.0 mm Number of attempts: 1 Airway Equipment and Method: Stylet and Oral airway Placement Confirmation: ETT inserted through vocal cords under direct vision, positive ETCO2 and breath sounds checked- equal and bilateral Secured at: 22 cm Tube secured with: Tape Dental Injury: Teeth and Oropharynx as per pre-operative assessment

## 2022-07-17 NOTE — Op Note (Signed)
07/17/2022  12:58 PM  PATIENT:  Katherine Holmes  41 y.o. female  PRE-OPERATIVE DIAGNOSIS:  intramural leiomyoma of uterus  POST-OPERATIVE DIAGNOSIS:  intramural leiomyoma of uterus  PROCEDURE:  Procedure(s): XI ROBOTIC ASSISTED TOTAL HYSTERECTOMY WITH BILATERAL SALPINGO OOPHORECTOMY (N/A) CYSTOSCOPY (N/A) LAPAROSCOPIC LYSIS OF ADHESIONS (N/A)  SURGEON:  Surgeon(s) and Role:    * Bobbye Charleston, MD - Primary    * Rowland Lathe, MD - Assisting  EBL:  200 mL   LOCAL MEDICATIONS USED:  OTHER Ropivicaine and arista  SPECIMEN:  Source of Specimen:  uterus, tubes. Ovaries and cervix  DISPOSITION OF SPECIMEN:  PATHOLOGY  COUNTS:  YES  TOURNIQUET:  * No tourniquets in log *  DICTATION: .Note written in [place]: Admit for overnight observation  PATIENT DISPOSITION:  PACU - hemodynamically stable.   Delay start of Pharmacological VTE agent (>24hrs) due to surgical blood loss or risk of bleeding: not applicable  Complications:  None.  Findings: 10 weeks size uterus.  Ovaries had multiple cysts bilaterally and were adhesed to the posterior fundus.  The tubes were markedly dilated and tortuous and adhesed to the ovaries and uterus.  The cul de sac was completely obliterated and the sigmoid adhesed to the posterior uterus.  The R side of the uterus was adhesed to the pelvic side wall. The ureters were skeletonized and identified during multiple points of the case and were always out of the field of dissection.  On cystoscopy, the bladder was intact and bilateral spill was seen from each ureteral oriface.    Technique:  After adequate anesthesia was achieved the patient was positioned, prepped and draped in usual sterile fashion.  A speculum was placed in the vagina and the cervix dilated with pratt dilators.  The Advincula with the 2.5 cm Koh ring placed in proper fashion.  The  Speculum was removed and the bladder catheterized with a foley.    Attention was  turned to the abdomen where a 1 cm incision was made at [employer] point.  The veress needle was inserted without aspiration of bowel contents or blood.  The long 5 mm airseal trocar was placed with optiview and the other three trocar sites were marked out, all approximately 10 cm from each other and below the airseal. An 8.5 mm trocar was introduced above the umbilicus and the other 8.5 mm trocars were placed on either side of the camera port.  All trocars were inserted under direct visualization of the camera.  The patient was placed in trendelenburg and then the Robot docked.  The bipolar forceps were placed on arm 1 and the Hot shears on arm 3 and introduced under direct visualization of the camera.   I then broke scrub and sat down at the console.  The above findings were noted and the ureters identified well out of the field of dissection.  The right round ligament was identified and cauterized with the bipolar.  It  was then divided with the bipolar cautery and shears.  The peritoneum parallel to the IP ligament was carefully incised and the posterior broad ligament was then incised under the right IP ligament with care to be above the ureter.  The bowel was carefully removed from the posterior uterus with mostly blunt dissection and the R IP ligament was identified.  The R ureter was identified and skeletonized for id.  Once we identified the ureter,  the R IP ligament was cauterized with the bipolar and then incised  with the shears.  The scar tissue was cauterized and opened to allow access to the inside of the Broad and cardinal ligaments, which were then cauterized against the cervix to the level of the Koh ring, securing the uterine artery.  Each pedicle was then incised with the shears.  The anterior leaf was then incised at the reflection of the vessico-uterine junction and the lateral bladder retracted inferiorly after the round ligament had been divided with the bipolar forceps.  The L round ligament  was cauterized with the bipolar and divided with the shears.  The L ureter was then identified and skeletonized to allow visualization.  Then the left IP ligament divided with the bipolar forceps and the scissors in the same manner as the right. The broad and cardinal ligaments were then cauterized on the left in the same way.   At the level of the internal os, the uterine arteries were bilaterally cauterized with the bipolar just above the ureters and careful to retract the ureters downward.  The ureters were identified well out of the field of dissection.   The anterior peritoneum was tented up and incised with the monopolar and the bladder retracted inferiorly.  The vesicovaginal fascia was incised with the monopolar shears and then pushed inferiorly off the vagina to about one cm below the Koh ring.   The uterus was amputated at the level of the reflection of the vagina onto the cervix with monopolar cautery and then removed through the vagina.  The pedicles were checked with the gas pressure down and found to be hemostatic.  The vaginal cuff was closed with a running stitch of 2-0 V-Loc.  The Robot was undocked and arista placed and then ropivicaine was placed inside the peritoneal cavity at the cuff.  All instruments and trocars were withdrawn from the abdomen, the abdomen desufflated.  The skin incisions were closed with 4-0 vicryl R and dermabond.  The cystoscope was placed in the bladder and good spill was seen from the bilateral ureteral orifices.  The bladder was intact.    The patient tolerated the procedure well and was returned to the recovery room in stable condition.

## 2022-07-17 NOTE — Progress Notes (Signed)
There has been no change in the patients history, status or exam since the history and physical.  Vitals:   07/08/22 0946 07/17/22 0821  BP:  114/65  Pulse:  68  Resp:  15  Temp:  98 F (36.7 C)  TempSrc:  Oral  SpO2:  100%  Weight: 57.2 kg 59.4 kg  Height: '5\' 7"'$  (1.702 m) '5\' 7"'$  (1.702 m)    Results for orders placed or performed during the hospital encounter of 07/17/22 (from the past 72 hour(s))  Pregnancy, urine POC     Status: None   Collection Time: 07/17/22  7:56 AM  Result Value Ref Range   Preg Test, Ur NEGATIVE NEGATIVE    Comment:        THE SENSITIVITY OF THIS METHODOLOGY IS >24 mIU/mL     Daria Pastures

## 2022-07-17 NOTE — Anesthesia Postprocedure Evaluation (Signed)
Anesthesia Post Note  Patient: Katherine Holmes  Procedure(s) Performed: XI ROBOTIC ASSISTED TOTAL HYSTERECTOMY WITH BILATERAL SALPINGO OOPHORECTOMY (Abdomen) CYSTOSCOPY (Bladder) LAPAROSCOPIC LYSIS OF ADHESIONS (Abdomen)     Patient location during evaluation: PACU Anesthesia Type: General Level of consciousness: awake and alert Pain management: pain level controlled Vital Signs Assessment: post-procedure vital signs reviewed and stable Respiratory status: spontaneous breathing, nonlabored ventilation and respiratory function stable Cardiovascular status: stable and blood pressure returned to baseline Anesthetic complications: no   No notable events documented.  Last Vitals:  Vitals:   07/17/22 1430 07/17/22 1451  BP: 109/69 113/78  Pulse: 75 78  Resp: 15 14  Temp: 36.6 C 36.6 C  SpO2: 100% 100%                   Audry Pili

## 2022-07-17 NOTE — Progress Notes (Signed)
Spoke with Dr Philis Pique about patient's pharmacy being closed.  New RX called to [facility] on [address].

## 2022-07-17 NOTE — Transfer of Care (Signed)
Immediate Anesthesia Transfer of Care Note  Patient: Katherine Holmes  Procedure(s) Performed: Procedure(s) (LRB): XI ROBOTIC ASSISTED TOTAL HYSTERECTOMY WITH BILATERAL SALPINGO OOPHORECTOMY (N/A) CYSTOSCOPY (N/A) LAPAROSCOPIC LYSIS OF ADHESIONS (N/A)  Patient Location: PACU  Anesthesia Type: General  Level of Consciousness: awake, oriented, sedated and patient cooperative  Airway & Oxygen Therapy: Patient Spontanous Breathing and Patient connected to face mask oxygen  Post-op Assessment: Report given to PACU RN and Post -op Vital signs reviewed and stable  Post vital signs: Reviewed and stable  Complications: No apparent anesthesia complications Last Vitals:  Vitals Value Taken Time  BP 109/63 07/17/22 1300  Temp    Pulse 84 07/17/22 1301  Resp 15 07/17/22 1301  SpO2 100 % 07/17/22 1301  Vitals shown include unvalidated device data.  Last Pain:  Vitals:   07/17/22 0821  TempSrc: Oral  PainSc: 0-No pain      Patients Stated Pain Goal: 4 (61/60/73 7106)  Complications: No notable events documented.

## 2022-07-17 NOTE — Anesthesia Preprocedure Evaluation (Addendum)
Anesthesia Evaluation  Patient identified by MRN, date of birth, ID band Patient awake    Reviewed: Allergy & Precautions, NPO status , Patient's Chart, lab work & pertinent test results  History of Anesthesia Complications Negative for: history of anesthetic complications  Airway Mallampati: II  TM Distance: >3 FB Neck ROM: Full    Dental  (+) Dental Advisory Given, Teeth Intact   Pulmonary Current Smoker and Patient abstained from smoking.,    Pulmonary exam normal        Cardiovascular negative cardio ROS Normal cardiovascular exam     Neuro/Psych negative neurological ROS  negative psych ROS   GI/Hepatic Neg liver ROS,  IBS    Endo/Other  negative endocrine ROS  Renal/GU negative Renal ROS     Musculoskeletal negative musculoskeletal ROS (+)   Abdominal   Peds  Hematology  (+) Blood dyscrasia, anemia ,   Anesthesia Other Findings HSV  Reproductive/Obstetrics   intramural leiomyoma of uterus                             Anesthesia Physical Anesthesia Plan  ASA: 2  Anesthesia Plan: General   Post-op Pain Management: Tylenol PO (pre-op)* and Celebrex PO (pre-op)*   Induction: Intravenous  PONV Risk Score and Plan: 3 and Treatment may vary due to age or medical condition, Ondansetron, Dexamethasone, Midazolam and Scopolamine patch - Pre-op  Airway Management Planned: Oral ETT  Additional Equipment: None  Intra-op Plan:   Post-operative Plan: Extubation in OR  Informed Consent: I have reviewed the patients History and Physical, chart, labs and discussed the procedure including the risks, benefits and alternatives for the proposed anesthesia with the patient or authorized representative who has indicated his/her understanding and acceptance.     Dental advisory given  Plan Discussed with: CRNA and Anesthesiologist  Anesthesia Plan Comments:        Anesthesia  Quick Evaluation

## 2022-07-17 NOTE — Discharge Summary (Signed)
Physician Discharge Summary  Patient ID: Katherine Holmes MRN: [id number] DOB/AGE: 1981/07/02 41 y.o.  Admit date: 07/17/2022 Discharge date: 07/17/2022  Admission Diagnoses:fibroids and dysmenorrhea  Discharge Diagnoses: same plus pelvic adhesions Principal Problem:   Postoperative state  robotic assisted total laparoscopic hysterectomy with bilateral salpingectomies, cystoscopy, bilateral oopherectomies  Discharged Condition: good  Hospital Course: robotic assisted total laparoscopic hysterectomy with bilateral salpingectomies, cystoscopy, bilateral oopherectomies.   Consults: None  Significant Diagnostic Studies: none  Treatments: surgery: robotic assisted total laparoscopic hysterectomy with bilateral salpingectomies, cystoscopy, bilateral oopherectomies.    Discharge Exam: Blood pressure 123/68, pulse 68, temperature (!) 96.7 F (35.9 C), resp. rate (!) 23, height '5\' 7"'$  (1.702 m), weight 59.4 kg, SpO2 100 %.   Disposition: Discharge disposition: 01-Home or Self Care       Discharge Instructions     Call MD for:  temperature >100.4   Complete by: As directed    Diet - low sodium heart healthy   Complete by: As directed    Discharge instructions   Complete by: As directed    No driving on narcotics, no sexual activity for 2 weeks.   Increase activity slowly   Complete by: As directed    May shower / Bathe   Complete by: As directed    Shower, no bath for 2 weeks.   Remove dressing in 24 hours   Complete by: As directed    Sexual Activity Restrictions   Complete by: As directed    No sexual activity for 2 weeks.      Allergies as of 07/17/2022   No Known Allergies      Medication List     TAKE these medications    acyclovir 400 MG tablet Commonly known as: ZOVIRAX Take 400 mg by mouth 2 (two) times daily.   COLLAGEN EX Apply topically.   multivitamin with minerals Tabs tablet Take 1 tablet by mouth daily.   oxyCODONE 5 MG immediate  release tablet Commonly known as: Oxy IR/ROXICODONE Take 1 tablet (5 mg total) by mouth once as needed (for pain score of 1-4).         Signed: Daria Pastures 07/17/2022, 1:16 PM

## 2022-07-17 NOTE — Brief Op Note (Signed)
07/17/2022  12:58 PM  PATIENT:  Katherine Holmes  41 y.o. female  PRE-OPERATIVE DIAGNOSIS:  intramural leiomyoma of uterus  POST-OPERATIVE DIAGNOSIS:  intramural leiomyoma of uterus  PROCEDURE:  Procedure(s): XI ROBOTIC ASSISTED TOTAL HYSTERECTOMY WITH BILATERAL SALPINGO OOPHORECTOMY (N/A) CYSTOSCOPY (N/A) LAPAROSCOPIC LYSIS OF ADHESIONS (N/A)  SURGEON:  Surgeon(s) and Role:    * Bobbye Charleston, MD - Primary    * Rowland Lathe, MD - Assisting  EBL:  200 mL   LOCAL MEDICATIONS USED:  OTHER Ropivicaine and arista  SPECIMEN:  Source of Specimen:  uterus, tubes. Ovaries and cervix  DISPOSITION OF SPECIMEN:  PATHOLOGY  COUNTS:  YES  TOURNIQUET:  * No tourniquets in log *  DICTATION: .Note written in [place]: Admit for overnight observation  PATIENT DISPOSITION:  PACU - hemodynamically stable.   Delay start of Pharmacological VTE agent (>24hrs) due to surgical blood loss or risk of bleeding: not applicable

## 2022-07-17 NOTE — Discharge Instructions (Signed)

## 2022-07-18 LAB — SURGICAL PATHOLOGY

## 2022-07-19 ENCOUNTER — Encounter (HOSPITAL_BASED_OUTPATIENT_CLINIC_OR_DEPARTMENT_OTHER): Payer: Self-pay | Admitting: Obstetrics and Gynecology

## 2022-09-12 DIAGNOSIS — N951 Menopausal and female climacteric states: Secondary | ICD-10-CM | POA: Insufficient documentation

## 2022-11-29 DIAGNOSIS — N951 Menopausal and female climacteric states: Secondary | ICD-10-CM | POA: Insufficient documentation

## 2023-01-28 DIAGNOSIS — L709 Acne, unspecified: Secondary | ICD-10-CM | POA: Insufficient documentation

## 2023-01-28 DIAGNOSIS — L659 Nonscarring hair loss, unspecified: Secondary | ICD-10-CM | POA: Insufficient documentation

## 2023-05-06 DIAGNOSIS — E8940 Asymptomatic postprocedural ovarian failure: Secondary | ICD-10-CM | POA: Insufficient documentation

## 2023-07-22 DIAGNOSIS — R61 Generalized hyperhidrosis: Secondary | ICD-10-CM | POA: Insufficient documentation

## 2023-12-25 ENCOUNTER — Other Ambulatory Visit (HOSPITAL_COMMUNITY): Payer: Self-pay

## 2024-02-11 DIAGNOSIS — N952 Postmenopausal atrophic vaginitis: Secondary | ICD-10-CM | POA: Insufficient documentation

## 2024-02-16 DIAGNOSIS — B3731 Acute candidiasis of vulva and vagina: Secondary | ICD-10-CM | POA: Insufficient documentation

## 2024-04-28 ENCOUNTER — Encounter: Payer: Self-pay | Admitting: Emergency Medicine

## 2024-04-28 ENCOUNTER — Ambulatory Visit
Admission: EM | Admit: 2024-04-28 | Discharge: 2024-04-28 | Disposition: A | Discharge status: Home / Self Care | Payer: PRIVATE HEALTH INSURANCE | Attending: Family Medicine | Admitting: Family Medicine

## 2024-04-28 DIAGNOSIS — J029 Acute pharyngitis, unspecified: Secondary | ICD-10-CM

## 2024-04-28 DIAGNOSIS — F17200 Nicotine dependence, unspecified, uncomplicated: Secondary | ICD-10-CM | POA: Insufficient documentation

## 2024-04-28 DIAGNOSIS — J039 Acute tonsillitis, unspecified: Secondary | ICD-10-CM

## 2024-04-28 LAB — POCT RAPID STREP A (OFFICE): Rapid Strep A Screen: NEGATIVE

## 2024-04-28 MED ORDER — AMOXICILLIN 875 MG PO TABS: 875.0000 mg | ORAL_TABLET | Freq: Two times a day (BID) | ORAL | 0 refills | Status: AC

## 2024-04-28 MED ORDER — IBUPROFEN 800 MG PO TABS: 800.0000 mg | ORAL_TABLET | Freq: Three times a day (TID) | ORAL | 0 refills | Status: AC

## 2024-04-28 NOTE — ED Triage Notes (Addendum)
 Pt reports sore throat, nasal congestion, and headache x3 days. Denies fevers or chills. Dayquil with no relief. White patches noted bilaterally.

## 2024-04-29 NOTE — ED Provider Notes (Signed)
 [facility]   [id number] 04/28/24 Arrival Time: 1220  ASSESSMENT & PLAN:  1. Sore throat   2. Acute tonsillitis, unspecified etiology     No signs of peritonsillar abscess. Will treat empirically for strep based on exam.  Meds ordered this encounter  Medications   amoxicillin  (AMOXIL ) 875 MG tablet    Sig: Take 1 tablet (875 mg total) by mouth 2 (two) times daily for 10 days.    Dispense:  20 tablet    Refill:  0   ibuprofen  (ADVIL ) 800 MG tablet    Sig: Take 1 tablet (800 mg total) by mouth 3 (three) times daily with meals.    Dispense:  21 tablet    Refill:  0    Results for orders placed or performed during the hospital encounter of 04/28/24  POCT rapid strep A   Collection Time: 04/28/24  1:24 PM  Result Value Ref Range   Rapid Strep A Screen Negative    Labs Reviewed  POCT RAPID STREP A (OFFICE) - Normal    OTC analgesics and throat care as needed  Instructed to finish full 10 day course of antibiotics. Will follow up if not showing significant improvement over the next 24-48 hours.   Reviewed expectations re: course of current medical issues. Questions answered. Outlined signs and symptoms indicating need for more acute intervention. Patient verbalized understanding. After Visit Summary given.   SUBJECTIVE:  Katherine Holmes is a 43 y.o. female who reports a sore throat; abrupt onset x 2 days; with HA. Unsure of any temp. Dayquil without relief. Otherwise well.    OBJECTIVE:  Vitals:   04/28/24 1239  BP: 111/74  Pulse: 95  Resp: 14  Temp: 98.2 F (36.8 C)  TempSrc: Oral  SpO2: 97%     General appearance: alert; no distress HEENT: throat with moderate erythema and with exudative tonsillar hypertrophy bilaterally; uvula is midline Neck: supple with FROM; small bilateral cervical LAD Lungs: speaks full sentences without difficulty; unlabored Abd: soft; non-tender Skin: reveals no rash; warm and dry Psychological: alert and  cooperative; normal mood and affect  No Known Allergies  Past Medical History:  Diagnosis Date   Anemia    hgb 10.6 on 02/14/21   Endometriosis    Genital herpes    takes daily acyclovir   Heart murmur    as a baby   IBS (irritable bowel syndrome) 07/03/2022   w/diarrhea, LOV with Dr. Porfirio Bristol at [facility], 10/02/2021   Social History   Socioeconomic History   Marital status: Widowed    Spouse name: Not on file   Number of children: Not on file   Years of education: Not on file   Highest education level: Not on file  Occupational History   Not on file  Tobacco Use   Smoking status: Former    Current packs/day: 1.00    Average packs/day: 1 pack/day for 24.0 years (24.0 ttl pk-yrs)    Types: Cigarettes   Smokeless tobacco: Never  Vaping Use   Vaping status: Former  Substance and Sexual Activity   Alcohol use: Not Currently   Drug use: No   Sexual activity: Not on file  Other Topics Concern   Not on file  Social History Narrative   Not on file   Social Drivers of Health   Financial Resource Strain: Not on file  Food Insecurity: No Food Insecurity (02/14/2021)   Received from [facility], [facility]   Hunger Vital Sign  Worried About Programme researcher, broadcasting/film/video in the Last Year: Never true    Ran Out of Food in the Last Year: Never true  Transportation Needs: Not on file  Physical Activity: Not on file  Stress: Not on file  Social Connections: Unknown (04/05/2022)   Received from [facility]   Social Network    Social Network: Not on file  Intimate Partner Violence: Unknown (02/27/2022)   Received from [facility]   HITS    Physically Hurt: Not on file    Insult or Talk Down To: Not on file    Threaten Physical Harm: Not on file    Scream or Curse: Not on file   Family History  Problem Relation Age of Onset   Diabetes Mother    Hypertension Mother    Hypertension Father    Hypertension Sister    Cancer Maternal Grandmother             Afton Albright, MD 04/29/24 1207
